# Patient Record
Sex: Male | Born: 1950 | Race: White | Hispanic: No | Marital: Married | Smoking: Current every day smoker
Health system: Southern US, Community
[De-identification: ages and names within clinical notes are randomized; demographics above are authoritative.]

## PROBLEM LIST (undated history)

## (undated) DIAGNOSIS — E785 Hyperlipidemia, unspecified: Secondary | ICD-10-CM

## (undated) DIAGNOSIS — N200 Calculus of kidney: Secondary | ICD-10-CM

## (undated) DIAGNOSIS — E213 Hyperparathyroidism, unspecified: Secondary | ICD-10-CM

## (undated) DIAGNOSIS — N529 Male erectile dysfunction, unspecified: Secondary | ICD-10-CM

## (undated) DIAGNOSIS — E876 Hypokalemia: Secondary | ICD-10-CM

## (undated) DIAGNOSIS — M1990 Unspecified osteoarthritis, unspecified site: Secondary | ICD-10-CM

## (undated) HISTORY — DX: Male erectile dysfunction, unspecified: N52.9

## (undated) HISTORY — PX: THYROID SURGERY: SHX805

## (undated) HISTORY — PX: HERNIA REPAIR: SHX51

## (undated) HISTORY — PX: FRACTURE SURGERY: SHX138

## (undated) HISTORY — DX: Hypercalcemia: E83.52

## (undated) HISTORY — DX: Hyperlipidemia, unspecified: E78.5

## (undated) HISTORY — PX: BACK SURGERY: SHX140

## (undated) HISTORY — PX: HYDROCELE EXCISION: SHX482

## (undated) HISTORY — PX: VASECTOMY: SHX75

## (undated) HISTORY — DX: Hyperparathyroidism, unspecified: E21.3

---

## 2014-06-09 ENCOUNTER — Encounter: Payer: Self-pay | Admitting: *Deleted

## 2014-07-11 ENCOUNTER — Ambulatory Visit (INDEPENDENT_AMBULATORY_CARE_PROVIDER_SITE_OTHER): Payer: BLUE CROSS/BLUE SHIELD | Admitting: Family Medicine

## 2014-07-11 ENCOUNTER — Encounter: Payer: Self-pay | Admitting: Family Medicine

## 2014-07-11 VITALS — BP 132/68 | HR 68 | Temp 97.7°F | Resp 18 | Ht 68.0 in | Wt 176.0 lb

## 2014-07-11 DIAGNOSIS — Z7189 Other specified counseling: Secondary | ICD-10-CM

## 2014-07-11 DIAGNOSIS — Z720 Tobacco use: Secondary | ICD-10-CM

## 2014-07-11 DIAGNOSIS — Z7689 Persons encountering health services in other specified circumstances: Secondary | ICD-10-CM

## 2014-07-11 DIAGNOSIS — Z23 Encounter for immunization: Secondary | ICD-10-CM

## 2014-07-11 DIAGNOSIS — D485 Neoplasm of uncertain behavior of skin: Secondary | ICD-10-CM

## 2014-07-11 DIAGNOSIS — F17200 Nicotine dependence, unspecified, uncomplicated: Secondary | ICD-10-CM

## 2014-07-11 DIAGNOSIS — E785 Hyperlipidemia, unspecified: Secondary | ICD-10-CM

## 2014-07-11 NOTE — Progress Notes (Signed)
Subjective:    Patient ID: James Hays, male    DOB: 1951/05/08, 64 y.o.   MRN: [id number]  HPI Patient is a very pleasant 64 year old white male who is here today to establish care. Patient does have erectile dysfunction and would like to try Viagra. Otherwise he has no concerns today. Patient is a pack-a-day smoker. He is in the pre-contemplative phase for smoking cessation. His flu shot is up-to-date however he has never had a pneumonia vaccine. He has never had a colonoscopy. On examination today there is a 5 mm erythematous scaly papule on his right neck which is either an inflamed Seborrheic keratosis or a squamous cell carcinoma. This lesion has been there for 2 months and is growing and bleeds occasionally. He is not yet due for complete physical exam or lab work as his last appointment was less than a year ago with his previous doctor in [place]. Past Medical History  Diagnosis Date  . Hyperlipidemia    Past Surgical History  Procedure Laterality Date  . Vasectomy    . Fracture surgery      L foot  . Hydrocele excision    . Hernia repair      inguinal and umbilical   No current outpatient prescriptions on file prior to visit.   No current facility-administered medications on file prior to visit.   No Known Allergies History   Social History  . Marital Status: Married    Spouse Name: N/A    Number of Children: N/A  . Years of Education: N/A   Occupational History  . Not on file.   Social History Main Topics  . Smoking status: Current Every Day Smoker -- 1.00 packs/day    Types: Cigarettes  . Smokeless tobacco: Never Used  . Alcohol Use: No  . Drug Use: No  . Sexual Activity: Yes     Comment: work for [employer]    Other Topics Concern  . Not on file   Social History Narrative   Family History  Problem Relation Age of Onset  . Hypertension Mother   . Diabetes Mother   . Heart disease Father 73  . Hyperlipidemia Father   . Diabetes Brother       Review  of Systems  All other systems reviewed and are negative.      Objective:   Physical Exam  Constitutional: He is oriented to person, place, and time. He appears well-developed and well-nourished. No distress.  HENT:  Head: Normocephalic and atraumatic.  Right Ear: External ear normal.  Left Ear: External ear normal.  Nose: Nose normal.  Mouth/Throat: Oropharynx is clear and moist. No oropharyngeal exudate.  Eyes: Conjunctivae and EOM are normal. Pupils are equal, round, and reactive to light. Right eye exhibits no discharge. Left eye exhibits no discharge. No scleral icterus.  Neck: Neck supple. No JVD present.  Cardiovascular: Normal rate, regular rhythm and normal heart sounds.  Exam reveals no gallop and no friction rub.   No murmur heard. Pulmonary/Chest: Effort normal and breath sounds normal. No respiratory distress. He has no wheezes. He has no rales.  Abdominal: Soft. Bowel sounds are normal.  Musculoskeletal: He exhibits no edema.  Lymphadenopathy:    He has no cervical adenopathy.  Neurological: He is alert and oriented to person, place, and time. He has normal reflexes. He displays normal reflexes. No cranial nerve deficit. Coordination normal.  Skin: Rash noted. He is not diaphoretic. There is erythema.  Vitals reviewed.  Assessment & Plan:  Need for prophylactic vaccination against Streptococcus pneumoniae (pneumococcus) - Plan: Pneumococcal polysaccharide vaccine 23-valent greater than or equal to 2yo subcutaneous/IM  Neoplasm of uncertain behavior of skin - Plan: Pathology  HLD (hyperlipidemia)  Smoker  Establishing care with new doctor, encounter for  I encouraged smoking cessation. I recommended the patient get Pneumovax 23 because of his smoking. I would like the patient to return fasting for a CBC, CMP, fasting lipid panel, and PSA in preparation for complete physical exam. I anesthetized the lesion on his right neck using 0.1% lidocaine and then  performed a shave biopsy using sterile technique. The lesion was sent to pathology in a labeled container. Hemostasis was obtained using Drysol.. I gave the patient Viagra 50 mg tablets to use as samples for erectile dysfunction

## 2014-07-13 LAB — PATHOLOGY

## 2014-07-15 ENCOUNTER — Telehealth: Payer: Self-pay | Admitting: Family Medicine

## 2014-07-15 NOTE — Telephone Encounter (Signed)
-----   Message from [name], MD sent at 07/14/2014  7:08 AM EST ----- Area was cancer and spreads deeper.  Needs wider excision which we can do here if he so desires.

## 2014-07-15 NOTE — Telephone Encounter (Signed)
Pt made aware of pathology report and has opted for Dr Dennard Schaumann to due next procedure.  Appt made.

## 2014-07-22 ENCOUNTER — Ambulatory Visit: Payer: Self-pay | Admitting: Family Medicine

## 2014-07-26 ENCOUNTER — Encounter: Payer: Self-pay | Admitting: Family Medicine

## 2014-07-26 ENCOUNTER — Ambulatory Visit (INDEPENDENT_AMBULATORY_CARE_PROVIDER_SITE_OTHER): Payer: BLUE CROSS/BLUE SHIELD | Admitting: Family Medicine

## 2014-07-26 VITALS — BP 110/72 | HR 80 | Temp 97.9°F | Resp 16 | Wt 170.0 lb

## 2014-07-26 DIAGNOSIS — C4492 Squamous cell carcinoma of skin, unspecified: Secondary | ICD-10-CM

## 2014-07-26 NOTE — Progress Notes (Signed)
   Subjective:    Patient ID: James Hays, male    DOB: May 14, 1951, 64 y.o.   MRN: [id number]  HPI James Hays 79-year-old white male. Please see my previous office visit. At that time we performed a shave biopsy of the lesion on his right neck just below the angle of the mandible. 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 Unfortunately the deep margin of the biopsy was still involved. The patient is here today for wider excision. Past Medical History  Diagnosis Date  . Hyperlipidemia    Past Surgical History  Procedure Laterality Date  . Vasectomy    . Fracture surgery      L foot  . Hydrocele excision    . Hernia repair      inguinal and umbilical   Current Outpatient Prescriptions on File Prior to Visit  Medication Sig Dispense Refill  . aspirin 81 MG tablet Take 81 mg by mouth daily.    . tadalafil (CIALIS) 10 MG tablet Take 10 mg by mouth daily as needed for erectile dysfunction.     No current facility-administered medications on file prior to visit.   No Known Allergies History   Social History  . Marital Status: Married    Spouse Name: N/A    Number of Children: N/A  . Years of Education: N/A   Occupational History  . Not on file.   Social History Main Topics  . Smoking status: Current Every Day Smoker -- 1.00 packs/day    Types: Cigarettes  . Smokeless tobacco: Never Used  . Alcohol Use: No  . Drug Use: No  . Sexual Activity: Yes     Comment: work for [employer]    Other Topics Concern  . Not on file   Social History Narrative      Review of Systems  All other systems reviewed and are negative.      Objective:   Physical Exam  Constitutional: He appears well-developed and well-nourished.  Cardiovascular: Normal rate and regular rhythm.   Pulmonary/Chest: Effort normal and breath sounds normal.  Vitals reviewed. There is a 5 mm erythematous scar on the upper right neck just below the angle of the mandible with some fine white scale which is the  previous biopsy site. The area was inked in an elliptical fashion 1.5 cm x 2 cm in a diagonal fashion following the skin folds.        Assessment & Plan:  SCC (squamous cell carcinoma) - Plan: Pathology  Area was anesthetized with 0.1% lidocaine with epinephrine. The patient was then prepped and draped in a sterile fashion.  A 1.5 cm x 2 cm elliptical excision was performed down to the subcutaneous fat approximately 1/8 of an inch deep. The lesion was sent to pathology and labeled container. The excision was performed in a diagonal fashion to follow the skin folds. The excision edges were then approximated using 3 simple interrupted 3-0 Ethilon sutures. There was minimal blood loss. The wound was then covered with Polysporin and a Band-Aid. Sutures out in one week.Marland Kitchen

## 2014-07-28 LAB — PATHOLOGY

## 2014-08-02 ENCOUNTER — Encounter: Payer: Self-pay | Admitting: Family Medicine

## 2014-08-02 ENCOUNTER — Ambulatory Visit (INDEPENDENT_AMBULATORY_CARE_PROVIDER_SITE_OTHER): Payer: BLUE CROSS/BLUE SHIELD | Admitting: Family Medicine

## 2014-08-02 VITALS — BP 130/72 | HR 74 | Temp 97.7°F | Resp 16

## 2014-08-02 DIAGNOSIS — L03221 Cellulitis of neck: Secondary | ICD-10-CM

## 2014-08-02 MED ORDER — SULFAMETHOXAZOLE-TRIMETHOPRIM 800-160 MG PO TABS: 1.0000 | ORAL_TABLET | Freq: Two times a day (BID) | ORAL | Status: DC

## 2014-08-02 NOTE — Progress Notes (Signed)
   Subjective:    Patient ID: James Hays, male    DOB: 10-06-1950, 64 y.o.   MRN: [id number]  HPI  please see my last office visit. One week ago, the patient underwent an excision of a squamous cell carcinoma in situ on his right neck. Biopsy confirmed complete excision of the cancer. Patient is here today for suture removal. He has 3 sutures in the right side of his neck. However the superior aspect of the incision is erythematous. When I removed the suture,  I am able to express a small amount of pus from the superior aspect of the wound. I was able to remove the bottom 2 sutures without any difficulty. It does appear that the wound is become superficially infected with most likely staph. Past Medical History  Diagnosis Date  . Hyperlipidemia    Past Surgical History  Procedure Laterality Date  . Vasectomy    . Fracture surgery      L foot  . Hydrocele excision    . Hernia repair      inguinal and umbilical   Current Outpatient Prescriptions on File Prior to Visit  Medication Sig Dispense Refill  . aspirin 81 MG tablet Take 81 mg by mouth daily.    . tadalafil (CIALIS) 10 MG tablet Take 10 mg by mouth daily as needed for erectile dysfunction.     No current facility-administered medications on file prior to visit.   No Known Allergies History   Social History  . Marital Status: Married    Spouse Name: N/A    Number of Children: N/A  . Years of Education: N/A   Occupational History  . Not on file.   Social History Main Topics  . Smoking status: Current Every Day Smoker -- 1.00 packs/day    Types: Cigarettes  . Smokeless tobacco: Never Used  . Alcohol Use: No  . Drug Use: No  . Sexual Activity: Yes     Comment: work for [employer]    Other Topics Concern  . Not on file   Social History Narrative      Review of Systems  All other systems reviewed and are negative.      Objective:   Physical Exam  Cardiovascular: Normal rate and regular rhythm.     Pulmonary/Chest: Effort normal and breath sounds normal.  Skin: There is erythema.   please see the description and history of preent illness        Assessment & Plan:  Cellulitis of neck - Plan: sulfamethoxazole-trimethoprim (SEPTRA DS) 800-160 MG per tablet   3 sutures removed without difficulty. However the superior suture express purulent drainage after I removed it. I was able to express all of the purulent material from the wound. Hopefully this will be sufficient just draining the small collection of infection to allow the infection to heal. If the redness begins to spread out the patient to start Bactrim double strength tablets 1 by mouth twice a day for 7 days. Also recommended that he apply Neosporin to this area twice daily for the next 3 days or until healed.

## 2014-09-12 ENCOUNTER — Encounter: Payer: Self-pay | Admitting: Family Medicine

## 2014-09-12 ENCOUNTER — Ambulatory Visit (INDEPENDENT_AMBULATORY_CARE_PROVIDER_SITE_OTHER): Payer: BLUE CROSS/BLUE SHIELD | Admitting: Family Medicine

## 2014-09-12 VITALS — BP 110/70 | HR 78 | Temp 98.2°F | Resp 20 | Ht 68.0 in | Wt 170.0 lb

## 2014-09-12 DIAGNOSIS — J329 Chronic sinusitis, unspecified: Secondary | ICD-10-CM

## 2014-09-12 DIAGNOSIS — J310 Chronic rhinitis: Secondary | ICD-10-CM

## 2014-09-12 MED ORDER — LEVOFLOXACIN 500 MG PO TABS: 500.0000 mg | ORAL_TABLET | Freq: Every day | ORAL | Status: DC

## 2014-09-12 NOTE — Progress Notes (Signed)
Subjective:    Patient ID: James Hays, male    DOB: 12-29-1950, 64 y.o.   MRN: [id number]  HPI Patient presents with 4 days of moderate frontal sinus headache, sinus pressure, rhinorrhea, head congestion, subjective fevers, postnasal drip, and coughing. His wife has the exact same symptoms. They began simultaneously. He is tender to palpation in both frontal and maxillary sinuses on examination. He is significant mucosal edema and clear rhinorrhea. He also appears to have bilateral middle ear effusions. Past Medical History  Diagnosis Date  . Hyperlipidemia    Past Surgical History  Procedure Laterality Date  . Vasectomy    . Fracture surgery      L foot  . Hydrocele excision    . Hernia repair      inguinal and umbilical   Current Outpatient Prescriptions on File Prior to Visit  Medication Sig Dispense Refill  . aspirin 81 MG tablet Take 81 mg by mouth daily.    . tadalafil (CIALIS) 10 MG tablet Take 10 mg by mouth daily as needed for erectile dysfunction.     No current facility-administered medications on file prior to visit.   No Known Allergies History   Social History  . Marital Status: Married    Spouse Name: N/A  . Number of Children: N/A  . Years of Education: N/A   Occupational History  . Not on file.   Social History Main Topics  . Smoking status: Current Every Day Smoker -- 1.00 packs/day    Types: Cigarettes  . Smokeless tobacco: Never Used  . Alcohol Use: No  . Drug Use: No  . Sexual Activity: Yes     Comment: work for [employer]    Other Topics Concern  . Not on file   Social History Narrative      Review of Systems  All other systems reviewed and are negative.      Objective:   Physical Exam  Constitutional: He appears well-developed and well-nourished. No distress.  HENT:  Right Ear: External ear normal. A middle ear effusion is present.  Left Ear: External ear normal. A middle ear effusion is present.  Nose: Mucosal edema and rhinorrhea  present. Right sinus exhibits maxillary sinus tenderness and frontal sinus tenderness. Left sinus exhibits maxillary sinus tenderness and frontal sinus tenderness.  Eyes: Conjunctivae are normal.  Neck: Neck supple.  Cardiovascular: Normal rate, regular rhythm and normal heart sounds.   Pulmonary/Chest: Effort normal. He has wheezes. He has no rales. He exhibits no tenderness.  Lymphadenopathy:    He has no cervical adenopathy.  Skin: He is not diaphoretic.  Vitals reviewed.         Assessment & Plan:  Rhinosinusitis - Plan: levofloxacin (LEVAQUIN) 500 MG tablet  Patient has acute rhinosinusitis. I explained to the patient that this is most likely a virus. It will take 7-10 days to run its course. I anticipate that the patient will feel poorly until Friday or Saturday. I recommended tincture of time using Sudafed and other decongestants such as Zyrtec to help alleviate his symptoms. He can also take Tylenol or ibuprofen as needed for fever and chills and body aches. I did give the patient a prescription for Levaquin 500 mg by mouth daily for 7 days. I explained to the patient not to get this antibiotic unless he develops extremely high fever or less the symptoms persist through next Friday or suddenly worsen. I did give him prescription for 14 tablets so that his wife who is also  my patient could have a week supply as well.

## 2015-01-17 ENCOUNTER — Telehealth: Payer: Self-pay | Admitting: Family Medicine

## 2015-01-17 ENCOUNTER — Encounter (HOSPITAL_COMMUNITY): Payer: Self-pay | Admitting: Emergency Medicine

## 2015-01-17 ENCOUNTER — Emergency Department (HOSPITAL_COMMUNITY)
Admission: EM | Admit: 2015-01-17 | Discharge: 2015-01-17 | Disposition: A | Discharge status: Home / Self Care | Payer: BLUE CROSS/BLUE SHIELD | Attending: Emergency Medicine | Admitting: Emergency Medicine

## 2015-01-17 DIAGNOSIS — M5441 Lumbago with sciatica, right side: Secondary | ICD-10-CM | POA: Insufficient documentation

## 2015-01-17 DIAGNOSIS — Z79899 Other long term (current) drug therapy: Secondary | ICD-10-CM | POA: Insufficient documentation

## 2015-01-17 DIAGNOSIS — M545 Low back pain: Secondary | ICD-10-CM | POA: Diagnosis present

## 2015-01-17 DIAGNOSIS — E785 Hyperlipidemia, unspecified: Secondary | ICD-10-CM | POA: Diagnosis not present

## 2015-01-17 DIAGNOSIS — Z720 Tobacco use: Secondary | ICD-10-CM | POA: Diagnosis not present

## 2015-01-17 DIAGNOSIS — Z7982 Long term (current) use of aspirin: Secondary | ICD-10-CM | POA: Diagnosis not present

## 2015-01-17 MED ORDER — CYCLOBENZAPRINE HCL 10 MG PO TABS: 10.0000 mg | ORAL_TABLET | Freq: Two times a day (BID) | ORAL | Status: DC | PRN

## 2015-01-17 MED ORDER — KETOROLAC TROMETHAMINE 60 MG/2ML IM SOLN
60.0000 mg | Freq: Once | INTRAMUSCULAR | Status: AC
  Administered 2015-01-17: 60 mg via INTRAMUSCULAR
  Filled 2015-01-17: qty 2

## 2015-01-17 MED ORDER — PREDNISONE 50 MG PO TABS: ORAL_TABLET | ORAL | Status: DC

## 2015-01-17 MED ORDER — PREDNISONE 50 MG PO TABS
60.0000 mg | ORAL_TABLET | Freq: Once | ORAL | Status: AC
  Administered 2015-01-17: 60 mg via ORAL
  Filled 2015-01-17 (×2): qty 1

## 2015-01-17 MED ORDER — OXYCODONE-ACETAMINOPHEN 5-325 MG PO TABS: 1.0000 | ORAL_TABLET | Freq: Four times a day (QID) | ORAL | Status: DC | PRN

## 2015-01-17 NOTE — Telephone Encounter (Signed)
I agree, NTBS

## 2015-01-17 NOTE — ED Notes (Signed)
While getting pt in gown, pt says that he has had several tick bites in last four weeks.

## 2015-01-17 NOTE — ED Notes (Signed)
MD at bedside. 

## 2015-01-17 NOTE — Discharge Instructions (Signed)
Prescription for pain medicine, prednisone, muscle relaxer. You can continue to take ibuprofen over-the-counter. Take all your medication with food. I'm concerned about a herniated disc. You will need to follow-up with your primary care doctor. He may order a MRI scan of your lower spine

## 2015-01-17 NOTE — Telephone Encounter (Signed)
Saw chiropractor yesterday.  In severe pain today, can't catch his breath.  Can hardly walk.  Wanting you to prescribe him pain meds.  Told him if in that much pain (he sounded in pain) needs to go to ED or back to his chiropractor.

## 2015-01-17 NOTE — ED Notes (Signed)
Pt went to see chiroprator yesterday for adjustment for lower back pain-- however, last night the pain in his back worstened and now radiates down into his Rt leg .

## 2015-01-17 NOTE — ED Provider Notes (Signed)
CSN: [id number]     Arrival date & time 01/17/15  1525 History   First MD Initiated Contact with Patient 01/17/15 1535     Chief Complaint  Patient presents with  . Back Pain     (Consider location/radiation/quality/duration/timing/severity/associated sxs/prior Treatment) HPI.... Right lower back pain for several days. No new injury. Patient went to chiropractor yesterday and was "adjusted". Now his pain travels down the right leg. No bowel or bladder incontinence. He has been taking ibuprofen 600 mg with minimal relief. No previous back problems. Positioning and palpation make pain worse.  Past Medical History  Diagnosis Date  . Hyperlipidemia    Past Surgical History  Procedure Laterality Date  . Vasectomy    . Fracture surgery      L foot  . Hydrocele excision    . Hernia repair      inguinal and umbilical   Family History  Problem Relation Age of Onset  . Hypertension Mother   . Diabetes Mother   . Heart disease Father 28  . Hyperlipidemia Father   . Diabetes Brother    History  Substance Use Topics  . Smoking status: Current Every Day Smoker -- 1.00 packs/day    Types: Cigarettes  . Smokeless tobacco: Never Used  . Alcohol Use: No    Review of Systems  All other systems reviewed and are negative.     Allergies  Review of patient's allergies indicates no known allergies.  Home Medications   Prior to Admission medications   Medication Sig Start Date End Date Taking? Authorizing Provider  aspirin 81 MG tablet Take 81 mg by mouth daily.   Yes Historical Provider, MD  cholecalciferol (VITAMIN D) 1000 UNITS tablet Take 1,000 Units by mouth daily.   Yes Historical Provider, MD  tadalafil (CIALIS) 10 MG tablet Take 10 mg by mouth daily as needed for erectile dysfunction.   Yes Historical Provider, MD  cyclobenzaprine (FLEXERIL) 10 MG tablet Take 1 tablet (10 mg total) by mouth 2 (two) times daily as needed for muscle spasms. 01/17/15   [name], MD   oxyCODONE-acetaminophen (PERCOCET/ROXICET) 5-325 MG per tablet Take 1-2 tablets by mouth every 6 (six) hours as needed. 01/17/15   [name], MD  predniSONE (DELTASONE) 50 MG tablet 1 tablet daily for 7 days 01/17/15   [name], MD   BP 138/78 mmHg  Pulse 90  Temp(Src) 97.8 F (36.6 C) (Oral)  Resp 18  Ht 5\' 8"  (1.727 m)  Wt 162 lb (73.483 kg)  BMI 24.64 kg/m2  SpO2 100% Physical Exam  Constitutional: He is oriented to person, place, and time. He appears well-developed and well-nourished.  HENT:  Head: Normocephalic and atraumatic.  Eyes: Conjunctivae and EOM are normal. Pupils are equal, round, and reactive to light.  Neck: Normal range of motion. Neck supple.  Cardiovascular: Normal rate and regular rhythm.   Pulmonary/Chest: Effort normal and breath sounds normal.  Abdominal: Soft. Bowel sounds are normal.  Musculoskeletal:  Muscular tenderness right lower back. Pain with straight leg raise on the right.  Neurological: He is alert and oriented to person, place, and time.  Skin: Skin is warm and dry.  Psychiatric: He has a normal mood and affect. His behavior is normal.  Nursing note and vitals reviewed.   ED Course  Procedures (including critical care time) Labs Review Labs Reviewed - No data to display  Imaging Review No results found.   EKG Interpretation None      MDM  Final diagnoses:  Right-sided low back pain with right-sided sciatica    I discussed the possibility of a herniated disc with the patient. He understands. I prescribed Toradol 60 mg IM and prednisone 60 mg in the ED. Discharge medication Percocet, Flexeril 10 mg, prednisone.  He will follow-up with his primary care doctor. He may need an MRI in the future.    [name], MD 01/17/15 [phone]

## 2015-01-20 ENCOUNTER — Telehealth: Payer: Self-pay | Admitting: Family Medicine

## 2015-01-20 NOTE — Telephone Encounter (Signed)
Patient has dropped off FMLA forms he has an appointment on Monday.

## 2015-01-23 ENCOUNTER — Encounter: Payer: Self-pay | Admitting: Family Medicine

## 2015-01-23 ENCOUNTER — Ambulatory Visit (INDEPENDENT_AMBULATORY_CARE_PROVIDER_SITE_OTHER): Payer: BLUE CROSS/BLUE SHIELD | Admitting: Family Medicine

## 2015-01-23 VITALS — BP 118/70 | HR 100 | Temp 98.0°F | Resp 24 | Ht 68.0 in | Wt 170.0 lb

## 2015-01-23 DIAGNOSIS — M5386 Other specified dorsopathies, lumbar region: Secondary | ICD-10-CM

## 2015-01-23 DIAGNOSIS — M5431 Sciatica, right side: Secondary | ICD-10-CM | POA: Diagnosis not present

## 2015-01-23 MED ORDER — OXYCODONE-ACETAMINOPHEN 5-325 MG PO TABS: 2.0000 | ORAL_TABLET | ORAL | Status: DC | PRN

## 2015-01-23 NOTE — Telephone Encounter (Signed)
Received FMLA ppw on my desk pt was seen today by Dr. 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  I have filled out Section lll for provider  Routed to Dr. Dennard Schaumann

## 2015-01-23 NOTE — Progress Notes (Signed)
Subjective:    Patient ID: James Hays, male    DOB: [birth year], 64 y.o.   MRN: [id number]  HPI  One week ago, the patient went to see his chiropractor due to some low back pain. Immediately upon manipulation of the spine, the patient developed searing pain in his lower back radiating into his right gluteus and down his right leg. Patient states that he can barely stand. He can barely walk. Patient states that his right leg feels like it's on fire. He also reports weakness in his right leg. He went to the emergency room. I reviewed the emergency room records. They placed the patient on high-dose prednisone. However his symptoms are not improving. He is in substantial pain today. He has a positive right leg raise. Muscle strength is 4 over 5 in the right leg with hip flexion and extension and knee flexion and extension. He has diminished reflexes at the knee but I believe is secondary to muscle spasm and guarding. He is severely tender to palpation in his right lower spine. He is unable to work. Past Medical History  Diagnosis Date  . Hyperlipidemia    Past Surgical History  Procedure Laterality Date  . Vasectomy    . Fracture surgery      L foot  . Hydrocele excision    . Hernia repair      inguinal and umbilical   Current Outpatient Prescriptions on File Prior to Visit  Medication Sig Dispense Refill  . aspirin 81 MG tablet Take 81 mg by mouth daily.    . cholecalciferol (VITAMIN D) 1000 UNITS tablet Take 1,000 Units by mouth daily.    . predniSONE (DELTASONE) 50 MG tablet 1 tablet daily for 7 days 7 tablet 0  . tadalafil (CIALIS) 10 MG tablet Take 10 mg by mouth daily as needed for erectile dysfunction.    . cyclobenzaprine (FLEXERIL) 10 MG tablet Take 1 tablet (10 mg total) by mouth 2 (two) times daily as needed for muscle spasms. (Patient not taking: Reported on 01/23/2015) 20 tablet 0  . oxyCODONE-acetaminophen (PERCOCET/ROXICET) 5-325 MG per tablet Take 1-2 tablets by mouth every  6 (six) hours as needed. (Patient not taking: Reported on 01/23/2015) 16 tablet 0   No current facility-administered medications on file prior to visit.   No Known Allergies History   Social History  . Marital Status: Married    Spouse Name: N/A  . Number of Children: N/A  . Years of Education: N/A   Occupational History  . Not on file.   Social History Main Topics  . Smoking status: Current Every Day Smoker -- 1.00 packs/day    Types: Cigarettes  . Smokeless tobacco: Never Used  . Alcohol Use: No  . Drug Use: No  . Sexual Activity: Yes     Comment: work for [employer]    Other Topics Concern  . Not on file   Social History Narrative     Review of Systems  All other systems reviewed and are negative.      Objective:   Physical Exam  Constitutional: He appears distressed.  Cardiovascular: Normal rate, regular rhythm and normal heart sounds.   Pulmonary/Chest: Effort normal and breath sounds normal.  Musculoskeletal:       Lumbar back: He exhibits decreased range of motion, tenderness, bony tenderness, pain and spasm.  Vitals reviewed.         Assessment & Plan:  Sciatica of right side associated with disorder of lumbar spine - Plan:  oxyCODONE-acetaminophen (ROXICET) 5-325 MG per tablet, MR Lumbar Spine Wo Contrast  Patient has severe right-sided sciatica and is failing conservative therapy. He is already failed high-dose prednisone. I recommended Percocet 5/325 one to 2 tablets every 4 hours as needed for pain. I recommended ibuprofen 800 mg every 8 hours. I will schedule the patient for an MRI of the lumbar spine as soon as possible. He would likely benefit from epidural sterile injection

## 2015-01-25 ENCOUNTER — Ambulatory Visit (HOSPITAL_COMMUNITY)
Admission: RE | Admit: 2015-01-25 | Discharge: 2015-01-25 | Disposition: A | Discharge status: Home / Self Care | Payer: BLUE CROSS/BLUE SHIELD | Source: Ambulatory Visit | Attending: Family Medicine | Admitting: Family Medicine

## 2015-01-25 DIAGNOSIS — M79604 Pain in right leg: Secondary | ICD-10-CM | POA: Diagnosis not present

## 2015-01-25 DIAGNOSIS — M5136 Other intervertebral disc degeneration, lumbar region: Secondary | ICD-10-CM | POA: Insufficient documentation

## 2015-01-25 DIAGNOSIS — M5126 Other intervertebral disc displacement, lumbar region: Secondary | ICD-10-CM | POA: Insufficient documentation

## 2015-01-25 DIAGNOSIS — M5386 Other specified dorsopathies, lumbar region: Secondary | ICD-10-CM

## 2015-01-25 DIAGNOSIS — M545 Low back pain: Secondary | ICD-10-CM | POA: Diagnosis present

## 2015-01-26 ENCOUNTER — Other Ambulatory Visit: Payer: Self-pay | Admitting: Family Medicine

## 2015-01-26 DIAGNOSIS — G589 Mononeuropathy, unspecified: Secondary | ICD-10-CM

## 2015-01-26 NOTE — Telephone Encounter (Signed)
Received ppw back on desk from provider and informed pt that they were filled out and ready to be faxed.   I faxed the form to his employer and pt had office visit so no form fee was required.

## 2015-01-27 ENCOUNTER — Telehealth: Payer: Self-pay | Admitting: Family Medicine

## 2015-01-27 NOTE — Telephone Encounter (Signed)
Patient is calling to tell you to cancel the neuro at [facility], he has one with dr [name]  [phone]

## 2015-01-31 ENCOUNTER — Telehealth: Payer: Self-pay | Admitting: Family Medicine

## 2015-01-31 NOTE — Telephone Encounter (Signed)
ok 

## 2015-01-31 NOTE — Telephone Encounter (Signed)
Patient was just making dr pickard aware that he was having back surgery tomorrow  [phone]

## 2015-02-03 NOTE — Telephone Encounter (Signed)
noted 

## 2015-02-10 ENCOUNTER — Encounter (HOSPITAL_COMMUNITY): Payer: Self-pay | Admitting: Emergency Medicine

## 2015-02-10 ENCOUNTER — Inpatient Hospital Stay (HOSPITAL_COMMUNITY)
Admission: EM | Admit: 2015-02-10 | Discharge: 2015-02-24 | DRG: 028 | Disposition: A | Discharge status: Home Health | Payer: BLUE CROSS/BLUE SHIELD | Attending: Neurosurgery | Admitting: Neurosurgery

## 2015-02-10 ENCOUNTER — Telehealth: Payer: Self-pay | Admitting: Family Medicine

## 2015-02-10 DIAGNOSIS — F17210 Nicotine dependence, cigarettes, uncomplicated: Secondary | ICD-10-CM | POA: Diagnosis not present

## 2015-02-10 DIAGNOSIS — R0789 Other chest pain: Secondary | ICD-10-CM | POA: Diagnosis not present

## 2015-02-10 DIAGNOSIS — Z7982 Long term (current) use of aspirin: Secondary | ICD-10-CM

## 2015-02-10 DIAGNOSIS — E785 Hyperlipidemia, unspecified: Secondary | ICD-10-CM | POA: Diagnosis not present

## 2015-02-10 DIAGNOSIS — G9782 Other postprocedural complications and disorders of nervous system: Secondary | ICD-10-CM | POA: Diagnosis not present

## 2015-02-10 DIAGNOSIS — K5909 Other constipation: Secondary | ICD-10-CM | POA: Diagnosis present

## 2015-02-10 DIAGNOSIS — R079 Chest pain, unspecified: Secondary | ICD-10-CM | POA: Diagnosis present

## 2015-02-10 DIAGNOSIS — B9561 Methicillin susceptible Staphylococcus aureus infection as the cause of diseases classified elsewhere: Secondary | ICD-10-CM | POA: Diagnosis not present

## 2015-02-10 DIAGNOSIS — G061 Intraspinal abscess and granuloma: Secondary | ICD-10-CM | POA: Diagnosis present

## 2015-02-10 DIAGNOSIS — T814XXA Infection following a procedure, initial encounter: Secondary | ICD-10-CM | POA: Diagnosis present

## 2015-02-10 DIAGNOSIS — G960 Cerebrospinal fluid leak: Secondary | ICD-10-CM | POA: Diagnosis present

## 2015-02-10 DIAGNOSIS — T819XXA Unspecified complication of procedure, initial encounter: Secondary | ICD-10-CM | POA: Diagnosis present

## 2015-02-10 DIAGNOSIS — R51 Headache: Secondary | ICD-10-CM | POA: Diagnosis not present

## 2015-02-10 DIAGNOSIS — Z419 Encounter for procedure for purposes other than remedying health state, unspecified: Secondary | ICD-10-CM

## 2015-02-10 DIAGNOSIS — Z9889 Other specified postprocedural states: Secondary | ICD-10-CM | POA: Diagnosis present

## 2015-02-10 DIAGNOSIS — R7881 Bacteremia: Secondary | ICD-10-CM | POA: Diagnosis not present

## 2015-02-10 DIAGNOSIS — K5900 Constipation, unspecified: Secondary | ICD-10-CM | POA: Diagnosis not present

## 2015-02-10 LAB — CBC WITH DIFFERENTIAL/PLATELET
Basophils Absolute: 0 10*3/uL (ref 0.0–0.1)
Basophils Relative: 0 % (ref 0–1)
EOS ABS: 0 10*3/uL (ref 0.0–0.7)
EOS PCT: 0 % (ref 0–5)
HEMATOCRIT: 44.6 % (ref 39.0–52.0)
HEMOGLOBIN: 15 g/dL (ref 13.0–17.0)
LYMPHS ABS: 1.3 10*3/uL (ref 0.7–4.0)
Lymphocytes Relative: 11 % — ABNORMAL LOW (ref 12–46)
MCH: 31.9 pg (ref 26.0–34.0)
MCHC: 33.6 g/dL (ref 30.0–36.0)
MCV: 94.9 fL (ref 78.0–100.0)
MONOS PCT: 8 % (ref 3–12)
Monocytes Absolute: 1 10*3/uL (ref 0.1–1.0)
NEUTROS ABS: 9.9 10*3/uL — AB (ref 1.7–7.7)
NEUTROS PCT: 81 % — AB (ref 43–77)
Platelets: 304 10*3/uL (ref 150–400)
RBC: 4.7 MIL/uL (ref 4.22–5.81)
RDW: 13.6 % (ref 11.5–15.5)
WBC: 12.2 10*3/uL — AB (ref 4.0–10.5)

## 2015-02-10 LAB — BASIC METABOLIC PANEL
Anion gap: 11 (ref 5–15)
BUN: 11 mg/dL (ref 6–20)
CO2: 22 mmol/L (ref 22–32)
CREATININE: 1.05 mg/dL (ref 0.61–1.24)
Calcium: 11.2 mg/dL — ABNORMAL HIGH (ref 8.9–10.3)
Chloride: 102 mmol/L (ref 101–111)
GFR calc non Af Amer: >60 mL/min (ref >60)
GLUCOSE: 129 mg/dL — AB (ref 65–99)
Potassium: 4 mmol/L (ref 3.5–5.1)
Sodium: 135 mmol/L (ref 135–145)

## 2015-02-10 LAB — SEDIMENTATION RATE: SED RATE: 95 mm/h — AB (ref 0–16)

## 2015-02-10 MED ORDER — ONDANSETRON HCL 4 MG/2ML IJ SOLN
INTRAMUSCULAR | Status: AC
  Administered 2015-02-10: 4 mg via INTRAVENOUS
  Filled 2015-02-10: qty 2

## 2015-02-10 MED ORDER — SODIUM CHLORIDE 0.9 % IV SOLN: INTRAVENOUS | Status: DC

## 2015-02-10 MED ORDER — SODIUM CHLORIDE 0.9 % IV SOLN: 250.0000 mL | INTRAVENOUS | Status: DC

## 2015-02-10 MED ORDER — OXYCODONE-ACETAMINOPHEN 5-325 MG PO TABS
2.0000 | ORAL_TABLET | ORAL | Status: DC | PRN
  Administered 2015-02-10 – 2015-02-12 (×6): 2 via ORAL
  Administered 2015-02-12: 1 via ORAL
  Filled 2015-02-10 (×6): qty 2

## 2015-02-10 MED ORDER — SODIUM CHLORIDE 0.9 % IV SOLN
INTRAVENOUS | Status: DC
  Administered 2015-02-10: 20:00:00 via INTRAVENOUS

## 2015-02-10 MED ORDER — DIAZEPAM 5 MG PO TABS
5.0000 mg | ORAL_TABLET | Freq: Four times a day (QID) | ORAL | Status: DC | PRN
  Administered 2015-02-11 – 2015-02-19 (×6): 5 mg via ORAL
  Filled 2015-02-10 (×6): qty 1

## 2015-02-10 MED ORDER — ONDANSETRON HCL 4 MG/2ML IJ SOLN
4.0000 mg | Freq: Once | INTRAMUSCULAR | Status: AC
  Administered 2015-02-10: 4 mg via INTRAVENOUS

## 2015-02-10 MED ORDER — SODIUM CHLORIDE 0.9 % IV SOLN
INTRAVENOUS | Status: DC
  Administered 2015-02-12: 10:00:00 via INTRAVENOUS
  Administered 2015-02-13: 1000 mL via INTRAVENOUS
  Administered 2015-02-15 – 2015-02-16 (×3): via INTRAVENOUS
  Administered 2015-02-17: 1000 mL via INTRAVENOUS
  Administered 2015-02-18 – 2015-02-19 (×2): via INTRAVENOUS
  Administered 2015-02-19: 75 mL/h via INTRAVENOUS
  Administered 2015-02-21: 22:00:00 via INTRAVENOUS

## 2015-02-10 MED ORDER — HEPARIN SODIUM (PORCINE) 5000 UNIT/ML IJ SOLN
5000.0000 [IU] | Freq: Three times a day (TID) | INTRAMUSCULAR | Status: DC
  Administered 2015-02-11 – 2015-02-24 (×37): 5000 [IU] via SUBCUTANEOUS
  Filled 2015-02-10 (×46): qty 1

## 2015-02-10 MED ORDER — ACETAMINOPHEN 325 MG PO TABS
650.0000 mg | ORAL_TABLET | ORAL | Status: DC | PRN
  Administered 2015-02-11: 650 mg via ORAL
  Administered 2015-02-12: 325 mg via ORAL
  Administered 2015-02-21 – 2015-02-23 (×5): 650 mg via ORAL
  Filled 2015-02-10 (×10): qty 2

## 2015-02-10 MED ORDER — SODIUM CHLORIDE 0.9 % IV BOLUS (SEPSIS)
1000.0000 mL | Freq: Once | INTRAVENOUS | Status: AC
  Administered 2015-02-10: 1000 mL via INTRAVENOUS

## 2015-02-10 MED ORDER — SODIUM CHLORIDE 0.9 % IJ SOLN: 3.0000 mL | INTRAMUSCULAR | Status: DC | PRN

## 2015-02-10 MED ORDER — SODIUM CHLORIDE 0.9 % IJ SOLN
3.0000 mL | Freq: Two times a day (BID) | INTRAMUSCULAR | Status: DC
  Administered 2015-02-11 – 2015-02-24 (×15): 3 mL via INTRAVENOUS

## 2015-02-10 MED ORDER — ONDANSETRON HCL 4 MG/2ML IJ SOLN: 4.0000 mg | INTRAMUSCULAR | Status: DC | PRN

## 2015-02-10 MED ORDER — VITAMIN D 1000 UNITS PO TABS
1000.0000 [IU] | ORAL_TABLET | Freq: Every day | ORAL | Status: DC
  Administered 2015-02-11 – 2015-02-24 (×14): 1000 [IU] via ORAL
  Filled 2015-02-10 (×14): qty 1

## 2015-02-10 MED ORDER — ACETAMINOPHEN 650 MG RE SUPP
650.0000 mg | RECTAL | Status: DC | PRN
  Filled 2015-02-10: qty 1

## 2015-02-10 NOTE — Telephone Encounter (Signed)
Patients wife Kennyth Lose calling regarding his back surgery that he had, now saying that he is having some symptoms that she is concerned about please call her at [phone]

## 2015-02-10 NOTE — ED Notes (Signed)
Back surgery last Wednesday - today started having a severe headache and white fluid coming out of his back surgical surgery.. Pt actively vomiting in triage

## 2015-02-10 NOTE — ED Notes (Signed)
EDP at bedside  

## 2015-02-10 NOTE — ED Provider Notes (Signed)
CSN: [id number]     Arrival date & time 02/10/15  1833 History   First MD Initiated Contact with Patient 02/10/15 1844     Chief Complaint  Patient presents with  . Post-op Problem     (Consider location/radiation/quality/duration/timing/severity/associated sxs/prior Treatment) HPI   James Hays is a 64 y.o. male who presents for evaluation of headache and drainage from wound. He had lumbar spine surgery, 9 days ago. He is doing well, walking easily and improving daily, until this afternoon. At that time, he developed a headache which he describes as severe, and noticed copious amount of fluid draining from his surgical wound. The fluid has been enough to soak his clothing. He denies fever, chills, cough, shortness of breath or chest pain. He has had nausea and vomiting, this afternoon. The surgery was done in [place], [place], by Dr. [name]. There are no other known modifying factors.    Past Medical History  Diagnosis Date  . Hyperlipidemia    Past Surgical History  Procedure Laterality Date  . Vasectomy    . Fracture surgery      L foot  . Hydrocele excision    . Hernia repair      inguinal and umbilical  . Back surgery     Family History  Problem Relation Age of Onset  . Hypertension Mother   . Diabetes Mother   . Heart disease Father 33  . Hyperlipidemia Father   . Diabetes Brother    Social History  Substance Use Topics  . Smoking status: Current Every Day Smoker -- 1.00 packs/day    Types: Cigarettes  . Smokeless tobacco: Never Used  . Alcohol Use: No    Review of Systems  All other systems reviewed and are negative.     Allergies  Review of patient's allergies indicates no known allergies.  Home Medications   Prior to Admission medications   Medication Sig Start Date End Date Taking? Authorizing Provider  cholecalciferol (VITAMIN D) 1000 UNITS tablet Take 1,000 Units by mouth daily.   Yes Historical Provider, MD  diazepam (VALIUM) 5 MG  tablet Take 5-10 mg by mouth every 6 (six) hours as needed. 02/01/15  Yes Historical Provider, MD  oxyCODONE-acetaminophen (ROXICET) 5-325 MG per tablet Take 2 tablets by mouth every 4 (four) hours as needed for severe pain. 01/23/15  Yes [name], MD  tadalafil (CIALIS) 10 MG tablet Take 10 mg by mouth daily as needed for erectile dysfunction.   Yes Historical Provider, MD  aspirin 81 MG tablet Take 81 mg by mouth daily.    Historical Provider, MD  predniSONE (DELTASONE) 50 MG tablet 1 tablet daily for 7 days Patient not taking: Reported on 02/10/2015 01/17/15   [name], MD   BP 115/69 mmHg  Pulse 100  Temp(Src) 98.3 F (36.8 C) (Oral)  Resp 14  Ht 6' (1.829 m)  Wt 164 lb (74.39 kg)  BMI 22.24 kg/m2  SpO2 93% Physical Exam  Constitutional: He is oriented to person, place, and time. He appears well-developed and well-nourished.  HENT:  Head: Normocephalic and atraumatic.  Right Ear: External ear normal.  Left Ear: External ear normal.  Eyes: Conjunctivae and EOM are normal. Pupils are equal, round, and reactive to light.  Neck: Normal range of motion and phonation normal. Neck supple.  Cardiovascular: Normal rate, regular rhythm and normal heart sounds.   Pulmonary/Chest: Effort normal and breath sounds normal. No respiratory distress. He has no wheezes. He exhibits no bony tenderness.  Abdominal: Soft. There is no tenderness.  Musculoskeletal: Normal range of motion. He exhibits no edema.  He has pain limited flexion of the right hip. Normal strength left leg. Upper lumbar wound, with a punctate area at the superior aspect which is draining fluid, characterized by thin, and slightly yellow. There is no large dehiscence of the surgical wound. There is no surrounding erythema or fluctuance. There is no tenderness around the wound.  Neurological: He is alert and oriented to person, place, and time. No cranial nerve deficit or sensory deficit. He exhibits normal muscle tone.  Coordination normal.  Skin: Skin is warm, dry and intact.  Psychiatric: He has a normal mood and affect. His behavior is normal. Judgment and thought content normal.  Nursing note and vitals reviewed.   ED Course  Procedures (including critical care time)  Medications  0.9 %  sodium chloride infusion ( Intravenous New Bag/Given 02/10/15 1956)  ondansetron (ZOFRAN) injection 4 mg (4 mg Intravenous Given 02/10/15 1917)  sodium chloride 0.9 % bolus 1,000 mL (0 mLs Intravenous Stopped 02/10/15 1953)  sodium chloride 0.9 % bolus 1,000 mL (1,000 mLs Intravenous New Bag/Given 02/10/15 1926)    Patient Vitals for the past 24 hrs:  BP Temp Temp src Pulse Resp SpO2 Height Weight  02/10/15 2040 115/69 mmHg 98.3 F (36.8 C) Oral 100 14 93 % - -  02/10/15 2030 118/69 mmHg - - 99 14 94 % - -  02/10/15 2015 - - - 98 13 96 % - -  02/10/15 2000 114/76 mmHg - - 100 13 93 % - -  02/10/15 1945 - - - 99 14 96 % - -  02/10/15 1930 116/76 mmHg - - 94 15 96 % - -  02/10/15 1915 - - - 98 14 90 % - -  02/10/15 1900 112/72 mmHg - - 95 19 90 % - -  02/10/15 1847 116/71 mmHg - - - 26 96 % - -  02/10/15 1844 115/77 mmHg 98.5 F (36.9 C) Oral 104 20 96 % 6' (1.829 m) 164 lb (74.39 kg)    19:45- case discussed with on-call neurosurgeon, Dr. Kathyrn Sheriff. He accepts the patient in transfer, to [facility] hospital for observation and treatment. He does not recommend empiric antibiotics this time.    8:00 PM Reevaluation with update and discussion. After initial assessment and treatment, an updated evaluation reveals family members updated, all questions answered.Daleen Bo L    Labs Review Labs Reviewed  CBC WITH DIFFERENTIAL/PLATELET - Abnormal; Notable for the following:    WBC 12.2 (*)    Neutrophils Relative % 81 (*)    Neutro Abs 9.9 (*)    Lymphocytes Relative 11 (*)    All other components within normal limits  BASIC METABOLIC PANEL - Abnormal; Notable for the following:    Glucose, Bld 129 (*)     Calcium 11.2 (*)    All other components within normal limits  CULTURE, BLOOD (ROUTINE X 2)  CULTURE, BLOOD (ROUTINE X 2)  SEDIMENTATION RATE    Imaging Review No results found.    EKG Interpretation   Date/Time:  Friday February 10 2015 18:47:06 EDT Ventricular Rate:  98 PR Interval:  155 QRS Duration: 98 QT Interval:  336 QTC Calculation: 429 R Axis:   70 Text Interpretation:  Sinus rhythm No old tracing to compare Confirmed by  [facility]  MD, Amyriah Buras [phone]) on 02/10/2015 8:46:10 PM      MDM   Final diagnoses:  Other postoperative complication  involving nervous system    Apparent spinal fluid leak, without frank infection following lumbar spine surgery. Patient will require evaluation and treatment by a neurosurgeon, at a neurosurgery center. Doubt sepsis, metabolic instability or impending vascular collapse.  Nursing Notes Reviewed/ Care Coordinated Applicable Imaging Reviewed Interpretation of Laboratory Data incorporated into ED treatment   Plan: Transfer to [facility]  Daleen Bo, MD 02/10/15 2046

## 2015-02-10 NOTE — Telephone Encounter (Signed)
Spoke to [place] and she states that the pt has a pin hole left of non healing and it is draining really bad with yellowish tint and he has a horrific HA with vomiting. I told her to take him to the ER to be evaluated. She states that he is sleeping right now but as soon as he wakes she will take him. Informed her if he needed to be seen on Monday to call asap. She verbalized understanding.

## 2015-02-11 ENCOUNTER — Encounter (HOSPITAL_COMMUNITY): Payer: Self-pay | Admitting: Anesthesiology

## 2015-02-11 ENCOUNTER — Observation Stay (HOSPITAL_COMMUNITY): Payer: BLUE CROSS/BLUE SHIELD | Admitting: Certified Registered Nurse Anesthetist

## 2015-02-11 ENCOUNTER — Observation Stay (HOSPITAL_COMMUNITY): Payer: BLUE CROSS/BLUE SHIELD

## 2015-02-11 ENCOUNTER — Encounter (HOSPITAL_COMMUNITY)
Admission: EM | Disposition: A | Discharge status: Home Health | Payer: Self-pay | Source: Home / Self Care | Attending: Neurosurgery

## 2015-02-11 DIAGNOSIS — G9782 Other postprocedural complications and disorders of nervous system: Secondary | ICD-10-CM | POA: Diagnosis present

## 2015-02-11 DIAGNOSIS — G061 Intraspinal abscess and granuloma: Secondary | ICD-10-CM | POA: Diagnosis present

## 2015-02-11 DIAGNOSIS — Z9889 Other specified postprocedural states: Secondary | ICD-10-CM | POA: Diagnosis not present

## 2015-02-11 DIAGNOSIS — F17210 Nicotine dependence, cigarettes, uncomplicated: Secondary | ICD-10-CM | POA: Diagnosis present

## 2015-02-11 DIAGNOSIS — R509 Fever, unspecified: Secondary | ICD-10-CM | POA: Diagnosis not present

## 2015-02-11 DIAGNOSIS — G960 Cerebrospinal fluid leak: Secondary | ICD-10-CM | POA: Diagnosis present

## 2015-02-11 DIAGNOSIS — E785 Hyperlipidemia, unspecified: Secondary | ICD-10-CM | POA: Diagnosis present

## 2015-02-11 DIAGNOSIS — Z7982 Long term (current) use of aspirin: Secondary | ICD-10-CM | POA: Diagnosis not present

## 2015-02-11 DIAGNOSIS — R51 Headache: Secondary | ICD-10-CM | POA: Diagnosis present

## 2015-02-11 DIAGNOSIS — K5909 Other constipation: Secondary | ICD-10-CM | POA: Diagnosis not present

## 2015-02-11 DIAGNOSIS — R0789 Other chest pain: Secondary | ICD-10-CM | POA: Diagnosis not present

## 2015-02-11 DIAGNOSIS — T814XXA Infection following a procedure, initial encounter: Secondary | ICD-10-CM | POA: Diagnosis present

## 2015-02-11 DIAGNOSIS — K5900 Constipation, unspecified: Secondary | ICD-10-CM | POA: Diagnosis present

## 2015-02-11 DIAGNOSIS — B9561 Methicillin susceptible Staphylococcus aureus infection as the cause of diseases classified elsewhere: Secondary | ICD-10-CM | POA: Diagnosis present

## 2015-02-11 DIAGNOSIS — R7881 Bacteremia: Secondary | ICD-10-CM | POA: Diagnosis present

## 2015-02-11 DIAGNOSIS — R079 Chest pain, unspecified: Secondary | ICD-10-CM | POA: Diagnosis not present

## 2015-02-11 DIAGNOSIS — Z419 Encounter for procedure for purposes other than remedying health state, unspecified: Secondary | ICD-10-CM | POA: Diagnosis not present

## 2015-02-11 HISTORY — PX: PLACEMENT OF LUMBAR DRAIN: SHX6028

## 2015-02-11 LAB — GLUCOSE, CAPILLARY: Glucose-Capillary: 90 mg/dL (ref 65–99)

## 2015-02-11 SURGERY — PLACEMENT OF LUMBAR DRAIN
Anesthesia: General | Site: Back

## 2015-02-11 MED ORDER — HYDROMORPHONE HCL 1 MG/ML IJ SOLN
0.2500 mg | INTRAMUSCULAR | Status: DC | PRN
  Administered 2015-02-11 – 2015-02-12 (×3): 0.5 mg via INTRAVENOUS
  Filled 2015-02-11: qty 1

## 2015-02-11 MED ORDER — OXYCODONE HCL 5 MG/5ML PO SOLN: 5.0000 mg | Freq: Once | ORAL | Status: DC | PRN

## 2015-02-11 MED ORDER — MIDAZOLAM HCL 5 MG/5ML IJ SOLN
INTRAMUSCULAR | Status: DC | PRN
  Administered 2015-02-11 (×2): 1 mg via INTRAVENOUS

## 2015-02-11 MED ORDER — ALPRAZOLAM 0.5 MG PO TABS
0.5000 mg | ORAL_TABLET | Freq: Two times a day (BID) | ORAL | Status: DC | PRN
  Administered 2015-02-11 – 2015-02-24 (×9): 0.5 mg via ORAL
  Filled 2015-02-11 (×10): qty 1

## 2015-02-11 MED ORDER — MIDAZOLAM HCL 2 MG/2ML IJ SOLN
INTRAMUSCULAR | Status: AC
  Filled 2015-02-11: qty 4

## 2015-02-11 MED ORDER — LIDOCAINE-EPINEPHRINE 1 %-1:100000 IJ SOLN
INTRAMUSCULAR | Status: DC | PRN
Start: 2015-02-11 — End: 2015-02-11
  Administered 2015-02-11: 5 mL via INTRADERMAL

## 2015-02-11 MED ORDER — CEFAZOLIN SODIUM-DEXTROSE 2-3 GM-% IV SOLR
INTRAVENOUS | Status: DC | PRN
  Administered 2015-02-11: 2 g via INTRAVENOUS

## 2015-02-11 MED ORDER — FENTANYL CITRATE (PF) 250 MCG/5ML IJ SOLN
INTRAMUSCULAR | Status: DC | PRN
  Administered 2015-02-11 (×3): 50 ug via INTRAVENOUS

## 2015-02-11 MED ORDER — FENTANYL CITRATE (PF) 250 MCG/5ML IJ SOLN
INTRAMUSCULAR | Status: AC
  Filled 2015-02-11: qty 5

## 2015-02-11 MED ORDER — DEXMEDETOMIDINE BOLUS VIA INFUSION
INTRAVENOUS | Status: DC | PRN
  Administered 2015-02-11 (×3): 4 ug via INTRAVENOUS

## 2015-02-11 MED ORDER — CEFAZOLIN SODIUM-DEXTROSE 2-3 GM-% IV SOLR
INTRAVENOUS | Status: AC
  Filled 2015-02-11: qty 50

## 2015-02-11 MED ORDER — HYDROMORPHONE HCL 1 MG/ML IJ SOLN
INTRAMUSCULAR | Status: AC
  Filled 2015-02-11: qty 1

## 2015-02-11 MED ORDER — ONDANSETRON HCL 4 MG/2ML IJ SOLN: 4.0000 mg | Freq: Once | INTRAMUSCULAR | Status: DC | PRN

## 2015-02-11 MED ORDER — DEXMEDETOMIDINE HCL IN NACL 200 MCG/50ML IV SOLN
INTRAVENOUS | Status: AC
  Filled 2015-02-11: qty 50

## 2015-02-11 MED ORDER — OXYCODONE HCL 5 MG PO TABS: 5.0000 mg | ORAL_TABLET | Freq: Once | ORAL | Status: DC | PRN

## 2015-02-11 MED ORDER — LACTATED RINGERS IV SOLN
INTRAVENOUS | Status: DC | PRN
  Administered 2015-02-11: 19:00:00 via INTRAVENOUS

## 2015-02-11 SURGICAL SUPPLY — 35 items
BLADE CLIPPER SURG (BLADE) IMPLANT
BLADE SURG 15 STRL LF DISP TIS (BLADE) ×1 IMPLANT
BLADE SURG 15 STRL SS (BLADE) ×2
DRAIN SUBARACHNOID (WOUND CARE) ×3 IMPLANT
DRAPE C-ARM 42X72 X-RAY (DRAPES) ×3 IMPLANT
DRAPE LAPAROTOMY 100X72X124 (DRAPES) ×3 IMPLANT
DRAPE PROXIMA HALF (DRAPES) ×3 IMPLANT
DRAPE SURG 17X23 STRL (DRAPES) ×3 IMPLANT
DRSG OPSITE POSTOP 3X4 (GAUZE/BANDAGES/DRESSINGS) ×9 IMPLANT
DURAPREP 26ML APPLICATOR (WOUND CARE) ×3 IMPLANT
GAUZE SPONGE 4X4 16PLY XRAY LF (GAUZE/BANDAGES/DRESSINGS) ×3 IMPLANT
GLOVE BIOGEL PI IND STRL 7.5 (GLOVE) ×1 IMPLANT
GLOVE BIOGEL PI INDICATOR 7.5 (GLOVE) ×2
GLOVE ECLIPSE 7.0 STRL STRAW (GLOVE) ×3 IMPLANT
GLOVE EXAM NITRILE LRG STRL (GLOVE) IMPLANT
GLOVE EXAM NITRILE MD LF STRL (GLOVE) IMPLANT
GLOVE EXAM NITRILE XL STR (GLOVE) IMPLANT
GLOVE EXAM NITRILE XS STR PU (GLOVE) IMPLANT
GOWN STRL REUS W/ TWL LRG LVL3 (GOWN DISPOSABLE) ×2 IMPLANT
GOWN STRL REUS W/ TWL XL LVL3 (GOWN DISPOSABLE) IMPLANT
GOWN STRL REUS W/TWL 2XL LVL3 (GOWN DISPOSABLE) IMPLANT
GOWN STRL REUS W/TWL LRG LVL3 (GOWN DISPOSABLE) ×4
GOWN STRL REUS W/TWL XL LVL3 (GOWN DISPOSABLE)
KIT BASIN OR (CUSTOM PROCEDURE TRAY) ×3 IMPLANT
KIT ROOM TURNOVER OR (KITS) ×3 IMPLANT
LIQUID BAND (GAUZE/BANDAGES/DRESSINGS) ×3 IMPLANT
NEEDLE HYPO 25X1 1.5 SAFETY (NEEDLE) ×6 IMPLANT
NS IRRIG 1000ML POUR BTL (IV SOLUTION) ×3 IMPLANT
PACK SURGICAL SETUP 50X90 (CUSTOM PROCEDURE TRAY) ×3 IMPLANT
PAD ARMBOARD 7.5X6 YLW CONV (MISCELLANEOUS) ×9 IMPLANT
SPECIMEN JAR SMALL (MISCELLANEOUS) IMPLANT
SUT VICRYL 3-0 RB1 18 ABS (SUTURE) IMPLANT
SYR CONTROL 10ML LL (SYRINGE) ×9 IMPLANT
TOWEL OR 17X24 6PK STRL BLUE (TOWEL DISPOSABLE) ×3 IMPLANT
TOWEL OR 17X26 10 PK STRL BLUE (TOWEL DISPOSABLE) ×3 IMPLANT

## 2015-02-11 NOTE — H&P (Addendum)
CC:  Chief Complaint  Patient presents with  . Post-op Problem    HPI: James Hays is a 64 y.o. male transferred to [facility] after presenting to [facility] with wound drainage. He underwent right L3-4 microdiscectomy at the outpatient surgical center about a week and a half ago by Dr. [name]. He says day before yesterday he noted that his clothes were saturated with fluid, and after he went to bed he noted his bedsheets were soaked. He describes the fluid as thin, mostly clear. Yesterday, he had fairly sudden onset of HA with continued drainage. HA was worsened when he sat up, and definitely worse with coughing. He therefore presented to the ED at [facility] and was transferred here for further care.  PMH: Past Medical History  Diagnosis Date  . Hyperlipidemia     PSH: Past Surgical History  Procedure Laterality Date  . Vasectomy    . Fracture surgery      L foot  . Hydrocele excision    . Hernia repair      inguinal and umbilical  . Back surgery      SH: Social History  Substance Use Topics  . Smoking status: Current Every Day Smoker -- 1.00 packs/day    Types: Cigarettes  . Smokeless tobacco: Never Used  . Alcohol Use: No    MEDS: Prior to Admission medications   Medication Sig Start Date End Date Taking? Authorizing Provider  cholecalciferol (VITAMIN D) 1000 UNITS tablet Take 1,000 Units by mouth daily.   Yes Historical Provider, MD  diazepam (VALIUM) 5 MG tablet Take 5-10 mg by mouth every 6 (six) hours as needed. 02/01/15  Yes Historical Provider, MD  oxyCODONE-acetaminophen (ROXICET) 5-325 MG per tablet Take 2 tablets by mouth every 4 (four) hours as needed for severe pain. 01/23/15  Yes [name], MD  tadalafil (CIALIS) 10 MG tablet Take 10 mg by mouth daily as needed for erectile dysfunction.   Yes Historical Provider, MD  aspirin 81 MG tablet Take 81 mg by mouth daily.    Historical Provider, MD  predniSONE (DELTASONE) 50 MG tablet 1 tablet daily for 7 days Patient not  taking: Reported on 02/10/2015 01/17/15   [name], MD    ALLERGY: No Known Allergies  ROS: ROS  NEUROLOGIC EXAM: Awake, alert, oriented Memory and concentration grossly intact Speech fluent, appropriate CN grossly intact Motor exam: Upper Extremities Deltoid Bicep Tricep Grip  Right 5/5 5/5 5/5 5/5  Left 5/5 5/5 5/5 5/5   Lower Extremity IP Quad PF DF EHL  Right 5/5 5/5 5/5 5/5 5/5  Left 5/5 5/5 5/5 5/5 5/5   Sensation grossly intact to LT Wound non-tender, no significant erythema. There is an ~47mm portion of the superior aspect of the wound from which thin fluid is draining.   IMPRESSION: - 64 y.o. male s/p lumbar decompression with likely postoperative CSF leak.  PLAN: - Pt to remain flat in bed  - If CSF leak persists despite being flat, may require lumbar drainage.

## 2015-02-11 NOTE — Progress Notes (Signed)
Patient transferred to neuro OR. Patient has wallet, glasses, cell phone in belongings. Patient stated he wanted to keep in room until given other room, RN put belongings including wallet, glasses, cell phone in utility room, patient educated about belongings policy. Patient's wife aware that belongings are on 4N floor.

## 2015-02-11 NOTE — Progress Notes (Signed)
1 of 2 blood cultures taken at [facility] yesterday has grown GPC in clusters. The second remains negative. I suspect this may be contaminant as the patient does not appear toxic, is afebrile, with minimal leukocytosis and essentially normal vital signs. Will hold off on starting empiric abx and will repeat blood cultures x2.

## 2015-02-11 NOTE — Progress Notes (Signed)
Pt arrived to unit via [facility]. Pt oriented to room, staff and plan of care.  MD notified of pts arrival.  Pts old incision site currently dry but with redness. Flat bedrest orders initiated.  Will continue to monitor

## 2015-02-11 NOTE — Anesthesia Preprocedure Evaluation (Addendum)
Anesthesia Evaluation  Patient identified by MRN, date of birth, ID band Patient awake    Reviewed: Allergy & Precautions, NPO status , Patient's Chart, lab work & pertinent test results  Airway Mallampati: II  TM Distance: >3 FB Neck ROM: Full    Dental  (+) Edentulous Upper, Edentulous Lower, Dental Advisory Given   Pulmonary Current Smoker,  breath sounds clear to auscultation        Cardiovascular Rhythm:Regular Rate:Normal     Neuro/Psych    GI/Hepatic   Endo/Other    Renal/GU      Musculoskeletal   Abdominal   Peds  Hematology   Anesthesia Other Findings   Reproductive/Obstetrics                           Anesthesia Physical Anesthesia Plan  ASA: II  Anesthesia Plan: MAC   Post-op Pain Management:    Induction: Intravenous  Airway Management Planned: Natural Airway and Nasal Cannula  Additional Equipment:   Intra-op Plan:   Post-operative Plan:   Informed Consent:   Dental advisory given  Plan Discussed with: CRNA, Anesthesiologist and Surgeon  Anesthesia Plan Comments:        Anesthesia Quick Evaluation

## 2015-02-11 NOTE — Anesthesia Postprocedure Evaluation (Signed)
  Anesthesia Post-op Note  Patient: James Hays  Procedure(s) Performed: Procedure(s): PLACEMENT OF LUMBAR DRAIN (N/A)  Patient Location: PACU  Anesthesia Type: MAC  Level of Consciousness: awake, alert  and oriented  Airway and Oxygen Therapy: Patient Spontanous Breathing  Post-op Pain: mild  Post-op Assessment: Post-op Vital signs reviewed, Patient's Cardiovascular Status Stable, Respiratory Function Stable, Patent Airway and Pain level controlled   LLE Sensation: Full sensation, No numbness, No pain, No tingling   RLE Sensation: Full sensation, No numbness, No pain, No tingling      Post-op Vital Signs: stable  Last Vitals:  Filed Vitals:   02/11/15 1954  BP: 115/69  Pulse: 95  Temp: 36.7 C  Resp: 20    Complications: No apparent anesthesia complications

## 2015-02-11 NOTE — Op Note (Signed)
PREOP DIAGNOSIS:  1. Postoperative CSF Leak   POSTOP DIAGNOSIS: Same  PROCEDURE: 1. Lumbar drain placement  SURGEON: Dr. [name], MD  ASSISTANT: None  ANESTHESIA: IV sedation with local  EBL: Minimal  SPECIMENS: None  DRAINS: Lumbar drain  COMPLICATIONS: None  CONDITION: Stable to PACU  HISTORY: James Hays is a 64 y.o. male who underwent right L3-4 microdiscectomy about 10 days ago and presented yesterday with clear wound drainage and postural headache. Lumbar drain was therefore indicated.  PROCEDURE IN DETAIL: After informed consent was obtained and witnessed, the patient was brought to the operating room. After he was sufficiently sedated, he was turned into the prone position and all pressure points were padded.   After timeout was conducted, [facility] fluoroscopy was used to confirm the surface projection of the interlaminar space in the lower lumbar spine. This area was then infiltrated with local anesthetic. Tuohy needle was then introduced into the thecal sac under fluoroscopic guidance. A stylette was removed and CSF was obtained. The lumbar drain catheter was then introduced and advanced to approximately 15 cm. Good flow of CSF from the distal end of the catheter was observed. The Touhy needle was then removed. The drain was then secured to the skin. The patient tolerated the procedure well and was returned to the postanesthesia care unit in stable hemodynamic condition.

## 2015-02-11 NOTE — Progress Notes (Signed)
Per [facility] lab, blood culture results show gram positive cocci. MD aware. Bed, linen, patient gown continues to get saturated from leak. Patient remains on flat bed rest. Patient changed, linen changed, dressing changed. MD aware that leak continues to saturate dressing, linen. Per MD, keep patient NPO. Patient is NPO, aware. Call bell within patient's reach. Will continue to monitor.

## 2015-02-11 NOTE — Transfer of Care (Signed)
Immediate Anesthesia Transfer of Care Note  Patient: James Hays  Procedure(s) Performed: Procedure(s): PLACEMENT OF LUMBAR DRAIN (N/A)  Patient Location: PACU  Anesthesia Type:MAC  Level of Consciousness: awake and alert   Airway & Oxygen Therapy: Patient Spontanous Breathing and Patient connected to nasal cannula oxygen  Post-op Assessment: Report given to RN and Post -op Vital signs reviewed and stable  Post vital signs: Reviewed and stable  Last Vitals:  Filed Vitals:   02/11/15 1806  BP: 108/53  Pulse: 89  Temp: 37.2 C  Resp: 20    Complications: No apparent anesthesia complications

## 2015-02-11 NOTE — Progress Notes (Signed)
Despite continued bedrest, pt has been leaking copious amounts of CSF from his wound, saturating gowns and bed linens throughout the day. I have therefore recommended proceeding with lumbar drain placement. Risks and benefits were reviewed, and after all questions were answered he provided consent.

## 2015-02-12 DIAGNOSIS — Z9889 Other specified postprocedural states: Secondary | ICD-10-CM

## 2015-02-12 DIAGNOSIS — Z419 Encounter for procedure for purposes other than remedying health state, unspecified: Secondary | ICD-10-CM

## 2015-02-12 DIAGNOSIS — G9782 Other postprocedural complications and disorders of nervous system: Principal | ICD-10-CM

## 2015-02-12 DIAGNOSIS — R7881 Bacteremia: Secondary | ICD-10-CM | POA: Diagnosis present

## 2015-02-12 DIAGNOSIS — B9561 Methicillin susceptible Staphylococcus aureus infection as the cause of diseases classified elsewhere: Secondary | ICD-10-CM

## 2015-02-12 LAB — MRSA PCR SCREENING: MRSA by PCR: NEGATIVE

## 2015-02-12 MED ORDER — OXYCODONE-ACETAMINOPHEN 5-325 MG PO TABS
1.0000 | ORAL_TABLET | ORAL | Status: DC | PRN
  Administered 2015-02-12 (×2): 1 via ORAL
  Administered 2015-02-13: 2 via ORAL
  Administered 2015-02-13: 1 via ORAL
  Administered 2015-02-13 – 2015-02-15 (×6): 2 via ORAL
  Administered 2015-02-15: 1 via ORAL
  Administered 2015-02-15 – 2015-02-18 (×10): 2 via ORAL
  Administered 2015-02-19 (×4): 1 via ORAL
  Administered 2015-02-19: 2 via ORAL
  Administered 2015-02-20 (×2): 1 via ORAL
  Administered 2015-02-20 – 2015-02-21 (×2): 2 via ORAL
  Administered 2015-02-22 – 2015-02-23 (×2): 1 via ORAL
  Administered 2015-02-24: 2 via ORAL
  Filled 2015-02-12 (×5): qty 2
  Filled 2015-02-12 (×2): qty 1
  Filled 2015-02-12 (×4): qty 2
  Filled 2015-02-12 (×2): qty 1
  Filled 2015-02-12: qty 2
  Filled 2015-02-12: qty 1
  Filled 2015-02-12 (×4): qty 2
  Filled 2015-02-12: qty 1
  Filled 2015-02-12 (×2): qty 2
  Filled 2015-02-12 (×2): qty 1
  Filled 2015-02-12 (×2): qty 2
  Filled 2015-02-12: qty 1
  Filled 2015-02-12 (×4): qty 2
  Filled 2015-02-12: qty 1

## 2015-02-12 MED ORDER — VANCOMYCIN HCL 10 G IV SOLR
1250.0000 mg | Freq: Once | INTRAVENOUS | Status: AC
  Administered 2015-02-12: 1250 mg via INTRAVENOUS
  Filled 2015-02-12: qty 1250

## 2015-02-12 MED ORDER — VANCOMYCIN HCL IN DEXTROSE 1-5 GM/200ML-% IV SOLN
1000.0000 mg | Freq: Two times a day (BID) | INTRAVENOUS | Status: DC
  Administered 2015-02-13: 1000 mg via INTRAVENOUS
  Filled 2015-02-12 (×2): qty 200

## 2015-02-12 MED ORDER — CEFAZOLIN SODIUM 1-5 GM-% IV SOLN
1.0000 g | Freq: Three times a day (TID) | INTRAVENOUS | Status: DC
  Administered 2015-02-12: 1 g via INTRAVENOUS
  Filled 2015-02-12 (×2): qty 50

## 2015-02-12 MED ORDER — CEFTRIAXONE SODIUM 2 G IJ SOLR
2.0000 g | INTRAMUSCULAR | Status: DC
  Administered 2015-02-12: 2 g via INTRAVENOUS
  Filled 2015-02-12 (×2): qty 2

## 2015-02-12 NOTE — Consult Note (Signed)
[facility] for Infectious Disease    Date of Admission:  02/10/2015  Date of Consult:  02/12/2015  Reason for Consult: staphylococcus aureus bacteremia Referring Physician: Auto-consult for Staphylococcus Aureus per polic adopted by [facility], and also consult for Dr. Kathyrn Sheriff   HPI: James Hays is an 64 y.o. male who underwent L3-4 microdiscectomy at the outpatient surgical center about a week and a half ago by Dr. [name]. He had been doing well until 2 days prior to admission when he began noticing that his close were soaked with fluid that was coming from his surgical site. He described the fluid to me as initially having some whitish material in it but later largely clear occasionally with some blood in it. Several bedsheets were being soaked through and he was developing a severe headache. Headache worse when he sat up and with coughing and movement. He came to the emerge department [facility] was also doubly transferred to [facility] for further care. He's been diagnosed with a postoperative CSF leak and has had placement of a lumbar drain.  At [facility] department he had blood cultures obtained in one of the 2 blood cultures is positive now for Staphylococcus aureus.while he does not appear to have been febrile at [facility] he has had temperatures up to 101 here at [facility].   I was notified via email this morning when the [facility] system flaged ID pharmacists and ID physicians to the positive culture and per [place] I engaged with patient, examined the chart and intiated antibiotics with vancomycin and cefazolin (now changed to rocephin)      Past Medical History  Diagnosis Date  . Hyperlipidemia     Past Surgical History  Procedure Laterality Date  . Vasectomy    . Fracture surgery      L foot  . Hydrocele excision    . Hernia repair      inguinal and umbilical  . Back surgery    ergies:   No Known Allergies   Medications: I  have reviewed patients current medications as documented in Epic Anti-infectives    Start     Dose/Rate Route Frequency Ordered Stop   02/13/15 0100  vancomycin (VANCOCIN) IVPB 1000 mg/200 mL premix     1,000 mg 200 mL/hr over 60 Minutes Intravenous Every 12 hours 02/12/15 1125     02/12/15 1400  cefTRIAXone (ROCEPHIN) 2 g in dextrose 5 % 50 mL IVPB     2 g 100 mL/hr over 30 Minutes Intravenous Every 24 hours 02/12/15 1346     02/12/15 1200  ceFAZolin (ANCEF) IVPB 1 g/50 mL premix  Status:  Discontinued     1 g 100 mL/hr over 30 Minutes Intravenous 3 times per day 02/12/15 1125 02/12/15 1346   02/12/15 1130  vancomycin (VANCOCIN) 1,250 mg in sodium chloride 0.9 % 250 mL IVPB     1,250 mg 166.7 mL/hr over 90 Minutes Intravenous  Once 02/12/15 1125 02/12/15 1410      Social History:  reports that he has been smoking Cigarettes.  He has been smoking about 1.00 pack per day. He has never used smokeless tobacco. He reports that he does not drink alcohol or use illicit drugs.  Family History  Problem Relation Age of Onset  . Hypertension Mother   . Diabetes Mother   . Heart disease Father 22  . Hyperlipidemia Father   . Diabetes Brother  As in HPI and primary teams notes otherwise 12 point review of systems is negative  Blood pressure 111/71, pulse 85, temperature 100.8 F (38.2 C), temperature source Oral, resp. rate 13, height 5\' 8"  (1.727 m), weight 170 lb 10.2 oz (77.4 kg), SpO2 96 %. General: Alert and awake, oriented x3,but sleepy "because they have me doped up" HEENT: anicteric sclera, , EOMI,  CVS regular rate, normal r,  no murmur rubs or gallops Chest: clear to auscultation bilaterally, no wheezing, rales or rhonchi Abdomen: soft nontender, nondistended, normal bowel sounds, Extremities: no  clubbing or edema noted bilaterally Skin: lumbar wound dressed and drain with clear CSF Neuro: nonfocal, strength and sensation intact   Results for orders placed or performed  during the hospital encounter of 02/10/15 (from the past 48 hour(s))  CBC with Differential     Status: Abnormal   Collection Time: 02/10/15  7:29 PM  Result Value Ref Range   WBC 12.2 (H) 4.0 - 10.5 K/uL   RBC 4.70 4.22 - 5.81 MIL/uL   Hemoglobin 15.0 13.0 - 17.0 g/dL   HCT 44.6 39.0 - 52.0 %   MCV 94.9 78.0 - 100.0 fL   MCH 31.9 26.0 - 34.0 pg   MCHC 33.6 30.0 - 36.0 g/dL   RDW 13.6 11.5 - 15.5 %   Platelets 304 150 - 400 K/uL   Neutrophils Relative % 81 (H) 43 - 77 %   Neutro Abs 9.9 (H) 1.7 - 7.7 K/uL   Lymphocytes Relative 11 (L) 12 - 46 %   Lymphs Abs 1.3 0.7 - 4.0 K/uL   Monocytes Relative 8 3 - 12 %   Monocytes Absolute 1.0 0.1 - 1.0 K/uL   Eosinophils Relative 0 0 - 5 %   Eosinophils Absolute 0.0 0.0 - 0.7 K/uL   Basophils Relative 0 0 - 1 %   Basophils Absolute 0.0 0.0 - 0.1 K/uL  Basic metabolic panel     Status: Abnormal   Collection Time: 02/10/15  7:29 PM  Result Value Ref Range   Sodium 135 135 - 145 mmol/L   Potassium 4.0 3.5 - 5.1 mmol/L   Chloride 102 101 - 111 mmol/L   CO2 22 22 - 32 mmol/L   Glucose, Bld 129 (H) 65 - 99 mg/dL   BUN 11 6 - 20 mg/dL   Creatinine, Ser 1.05 0.61 - 1.24 mg/dL   Calcium 11.2 (H) 8.9 - 10.3 mg/dL   GFR calc non Af Amer >60 >60 mL/min   GFR calc Af Amer >60 >60 mL/min    Comment: (NOTE) The eGFR has been calculated using the CKD EPI equation. This calculation has not been validated in all clinical situations. eGFR's persistently <60 mL/min signify possible Chronic Kidney Disease.    Anion gap 11 5 - 15  Sedimentation rate     Status: Abnormal   Collection Time: 02/10/15  7:29 PM  Result Value Ref Range   Sed Rate 95 (H) 0 - 16 mm/hr  Blood culture (routine x 2)     Status: None (Preliminary result)   Collection Time: 02/10/15  7:37 PM  Result Value Ref Range   Specimen Description BLOOD LEFT ARM    Special Requests BOTTLES DRAWN AEROBIC AND ANAEROBIC 6CC    Culture  Setup Time      GRAM POSITIVE COCCI IN  CLUSTERS ANAEROBIC BOTTLE ONLY CRITICAL RESULT CALLED TO, READ BACK BY AND VERIFIED WITH: J.CARMICHAEL,RN 02/11/15 @2135  BY V.WILKINS CONFIRMED BY T.C[place] Gram Stain Report Called  to,Read Back By and Verified With: [place] [id number] ON [id number] BY THOMPSON S.    Culture      STAPHYLOCOCCUS AUREUS Performed at [facility]    Report Status PENDING   Blood culture (routine x 2)     Status: None (Preliminary result)   Collection Time: 02/10/15  7:40 PM  Result Value Ref Range   Specimen Description BLOOD LEFT HAND    Special Requests BOTTLES DRAWN AEROBIC AND ANAEROBIC [place]    Culture NO GROWTH 2 DAYS    Report Status PENDING   Glucose, capillary     Status: None   Collection Time: 02/11/15  6:16 PM  Result Value Ref Range   Glucose-Capillary 90 65 - 99 mg/dL  MRSA PCR Screening     Status: None   Collection Time: 02/11/15  9:47 PM  Result Value Ref Range   MRSA by PCR NEGATIVE NEGATIVE    Comment:        The GeneXpert MRSA Assay (FDA approved for NASAL specimens only), is one component of a comprehensive MRSA colonization surveillance program. It is not intended to diagnose MRSA infection nor to guide or monitor treatment for MRSA infections.    @BRIEFLABTABLE (sdes,specrequest,cult,reptstatus)   ) Recent Results (from the past 720 hour(s))  Blood culture (routine x 2)     Status: None (Preliminary result)   Collection Time: 02/10/15  7:37 PM  Result Value Ref Range Status   Specimen Description BLOOD LEFT ARM  Final   Special Requests BOTTLES DRAWN AEROBIC AND ANAEROBIC 6CC  Final   Culture  Setup Time   Final    GRAM POSITIVE COCCI IN CLUSTERS ANAEROBIC BOTTLE ONLY CRITICAL RESULT CALLED TO, READ BACK BY AND VERIFIED WITH: J.CARMICHAEL,RN 02/11/15 @2135  BY V.WILKINS CONFIRMED BY T.C[place] Gram Stain Report Called to,Read Back By and Verified With: [place] K. AT 4536 ON [id number] BY THOMPSON S.    Culture   Final    STAPHYLOCOCCUS AUREUS Performed at  [facility]    Report Status PENDING  Incomplete  Blood culture (routine x 2)     Status: None (Preliminary result)   Collection Time: 02/10/15  7:40 PM  Result Value Ref Range Status   Specimen Description BLOOD LEFT HAND  Final   Special Requests BOTTLES DRAWN AEROBIC AND ANAEROBIC 6CC  Final   Culture NO GROWTH 2 DAYS  Final   Report Status PENDING  Incomplete  MRSA PCR Screening     Status: None   Collection Time: 02/11/15  9:47 PM  Result Value Ref Range Status   MRSA by PCR NEGATIVE NEGATIVE Final    Comment:        The GeneXpert MRSA Assay (FDA approved for NASAL specimens only), is one component of a comprehensive MRSA colonization surveillance program. It is not intended to diagnose MRSA infection nor to guide or monitor treatment for MRSA infections.      Impression/Recommendation  Active Problems:   Post-operative complication   Postoperative CSF leak   James Hays is a 64 y.o. male with  Postoperative CSF leak and positive blood culture for Staphylococcus Aureus   #1. Staphylococcus aureus bacteremia: We in Infectious Disease take even 1 positive blood culture = genuine bacteremia.  I can cite numerous examples including several active ID cases of severe SAB that were only picked up by 1/2 blood cultures and this is also well documented in the literature.  Coag negative staph, Viridans being only 1/2 I WOULD regard as contaminant  but not SAB and the identity of this SAB was only made known today around noon       [place] Antimicrobial Management Team Staphylococcus aureus bacteremia   Staphylococcus aureus bacteremia (SAB) is associated with a high rate of complications and mortality.  Specific aspects of clinical management are critical to optimizing the outcome of patients with SAB.  Therefore, the [facility] Antimicrobial Management Team [facility]) has initiated an intervention aimed at improving the management of SAB at [facility].  To do  so, Infectious Diseases physicians are providing an evidence-based consult for the management of all patients with SAB.     Yes No Comments  Perform follow-up blood cultures (even if the patient is afebrile) to ensure clearance of bacteremia $RemoveBefor'[x]'rXlGKreVxvHL$'[]'$  Will check tomorrow after he has been on abx first, he has another set  "pre abx"  Remove vascular catheter and obtain follow-up blood cultures after the removal of the catheter $RemoveBefo'[]'pMEQwXxkety$'[]'$  NO central lines  Perform echocardiography to evaluate for endocarditis (transthoracic ECHO is 40-50% sensitive, TEE is > 90% sensitive) $RemoveBefore'[]'XYdHteuzCUgCb$'[]'$  Please keep in mind, that neither test can definitively EXCLUDE endocarditis, and that should clinical suspicion remain high for endocarditis the patient should then still be treated with an "endocarditis" duration of therapy = 6 weeks  TTE ordered  Consult electrophysiologist to evaluate implanted cardiac device (pacemaker, ICD) $RemoveBeforeDE'[]'nhbZbNTXHRyxDDL$'[]'$  NA  Ensure source control $RemoveBefore'[]'ILbxdtQZBtGZO$'[]'$  Have all abscesses been drained effectively? Have deep seeded infections (septic joints or osteomyelitis) had appropriate surgical debridement?   I AM WORRIED ABOUT HIS OPERATIVE SITE SINCE IT IS THE LARGEST POTENTIAL PORTAL FOR A SKIN 'BUG" LIKE STAPH AUREUS TO HAVE ENTERED AND IT IS A DEEP OPERATIVE SITE  WHILE I DOUBT HE COULD HAVE OSTEOMYELITIS AND FLUID SEEMS LARGELY TO BE CSF AND NO PURULENCE AT THIS POINT I WOULD FAVOR AN MRI OR CT OF THE LUMBAR SPINE TO ELUCIDATE CSF AND OTHER POTENTIAL FLUID COLLECTIONS ABNROMALITIES KEEPING IN MIND HOW SENSITIVE AN MRI CAN BE IN POSTOP SETTING IN PARTICULAR   Investigate for "metastatic" sites of infection $RemoveBefo'[]'AcslQKKTOBX$'[]'$  Does the patient have ANY symptom or physical exam finding that would suggest a deeper infection (back or neck pain that may be suggestive of vertebral osteomyelitis or epidural abscess, muscle pain that could be a symptom of pyomyositis)?  Keep in mind that for deep seeded infections MRI imaging with contrast is  preferred rather than other often insensitive tests such as plain x-rays, especially early in a patient's presentation.  Change antibiotic therapy to VANCOMYCIN AND CEFTRIAXONE $RemoveBeforeD'[x]'AEZsaRsbTmwNRG$'[]'$  Beta-lactam antibiotics are preferred for MSSA due to higher cure rates.   If on Vancomycin, goal trough should be 15 - 20 mcg/mL  Estimated duration of IV antibiotic therapy:  6 WEEKS to 8 weeks likely $RemoveBefo'[]'jNIMNvsFsFk$'[]'$  Consult case management for probably prolonged outpatient IV antibiotic therapy   I have left my cell phone in 3[facility] so that Dr Kathyrn Sheriff can give me a call and we can discuss further in particular potentially imaging the L spine  02/12/2015, 4:01 PM   Thank you so much for this interesting consult  [place] [facility] [place] [phone] (pager) [phone] (office) 02/12/2015, 4:01 PM  [place] 02/12/2015, 4:01 PM

## 2015-02-12 NOTE — Progress Notes (Signed)
Pt seen and examined. No issues overnight. Pt has back soreness from lying flat. Otherwise no new c/o.  EXAM: Temp:  [98.1 F (36.7 C)-100.7 F (38.2 C)] 99.5 F (37.5 C) (08/14 0400) Pulse Rate:  [77-115] 87 (08/14 0700) Resp:  [10-26] 12 (08/14 0700) BP: (91-137)/(49-84) 100/66 mmHg (08/14 0700) SpO2:  [88 %-99 %] 99 % (08/14 0700) FiO2 (%):  [28 %-40 %] 40 % (08/14 0240) Weight:  [77.4 kg (170 lb 10.2 oz)] 77.4 kg (170 lb 10.2 oz) (08/13 2138) Intake/Output      08/13 0701 - 08/14 0700 08/14 0701 - 08/15 0700   I.V. (mL/kg) 1450 (18.7)    Total Intake(mL/kg) 1450 (18.7)    Urine (mL/kg/hr) 1350 (0.7)    Drains 81 (0) 0 (0)   Blood 10 (0)    Total Output 1441 0   Net +9 0         Awake, alert, oriented CN intact Moving BLE well Drain in place, dressings are partially saturated with CSF  LABS: Lab Results  Component Value Date   CREATININE 1.05 02/10/2015   BUN 11 02/10/2015   NA 135 02/10/2015   K 4.0 02/10/2015   CL 102 02/10/2015   CO2 22 02/10/2015   Lab Results  Component Value Date   WBC 12.2* 02/10/2015   HGB 15.0 02/10/2015   HCT 44.6 02/10/2015   MCV 94.9 02/10/2015   PLT 304 02/10/2015    IMPRESSION: - 64 y.o. male s/p L3-4 microdisc and postop CSF leak - LD in place - 1/2 blood cultures from ED positive, may be contaminant?  PLAN: - Will increased LD drainage to 15Q2 - Repeat blood cultures from yesterday pending

## 2015-02-12 NOTE — Progress Notes (Addendum)
INFECTIOUS DISEASE ATTENDING ADDENDUM:              [place] for Infectious Disease    THIS IS TO CLARIFY WHY I HAVE ORDERED ANTIBIOTICS AND TEST ON THIS PATIENT WITHOUT FORMAL CONSULATIONS  The [place] [place] passed policy approximately over 2 years agoe whereby   INFECTIOUS DISEASE AUTOMATICALLY IS CONSULTED FOR  ALL STAPHYLOCOCCUS AUREUS BACTEREMIA'S without need for phone call from the primary team  SAB is a very lethal bacteremia and needs to be treated by ID.  I realize that this patient has SAB in 1/2 blood cultures but it has been my personal experience and it is also bourne out by the literature that Twining TO be considered REAL and worked up accordingly  I DO PLAN to see this patient FORMALLY later today but I was trying to respond in REAL TIME to this patient's bacteremia and did so as soon as a received an email alert to his +blood cultures  As an aside we also in ID do also  "AUTO -CONSULTS" on every patient with Enterococcus , Candida in ANY BLOOD CULTURE and all HIV + tests   Rhina Brackett [place] 02/12/2015, 11:55 AM

## 2015-02-12 NOTE — Progress Notes (Signed)
ANTIBIOTIC CONSULT NOTE - INITIAL  Pharmacy Consult:  Vancomycin / Ancef Indication:  Staph bacteremia  No Known Allergies  Patient Measurements: Height: 5\' 8"  (172.7 cm) Weight: 170 lb 10.2 oz (77.4 kg) IBW/kg (Calculated) : 68.4  Vital Signs: Temp: 97.6 F (36.4 C) (08/14 0800) Temp Source: Axillary (08/14 0800) BP: 121/76 mmHg (08/14 1100) Pulse Rate: 101 (08/14 1100) Intake/Output from previous day: 08/13 0701 - 08/14 0700 In: 1450 [I.V.:1450] Out: 1441 [Urine:1350; Drains:81; Blood:10] Intake/Output from this shift: Total I/O In: 300 [I.V.:300] Out: 25 [Drains:25]  Labs:  Recent Labs  02/10/15 1929  WBC 12.2*  HGB 15.0  PLT 304  CREATININE 1.05   Estimated Creatinine Clearance: 70.6 mL/min (by C-G formula based on Cr of 1.05). No results for input(s): VANCOTROUGH, VANCOPEAK, VANCORANDOM, GENTTROUGH, GENTPEAK, GENTRANDOM, TOBRATROUGH, TOBRAPEAK, TOBRARND, AMIKACINPEAK, AMIKACINTROU, AMIKACIN in the last 72 hours.   Microbiology: Recent Results (from the past 720 hour(s))  Blood culture (routine x 2)     Status: None (Preliminary result)   Collection Time: 02/10/15  7:37 PM  Result Value Ref Range Status   Specimen Description BLOOD LEFT ARM  Final   Special Requests BOTTLES DRAWN AEROBIC AND ANAEROBIC 6CC  Final   Culture  Setup Time   Final    GRAM POSITIVE COCCI IN CLUSTERS ANAEROBIC BOTTLE ONLY CRITICAL RESULT CALLED TO, READ BACK BY AND VERIFIED WITH: J.CARMICHAEL,RN 02/11/15 @2135  BY V.WILKINS CONFIRMED BY T.C[place] Gram Stain Report Called to,Read Back By and Verified With: [place] K. AT [id number] ON [id number] BY THOMPSON S.    Culture   Final    STAPHYLOCOCCUS AUREUS Performed at [facility]    Report Status PENDING  Incomplete  Blood culture (routine x 2)     Status: None (Preliminary result)   Collection Time: 02/10/15  7:40 PM  Result Value Ref Range Status   Specimen Description BLOOD LEFT HAND  Final   Special Requests BOTTLES DRAWN  AEROBIC AND ANAEROBIC 6CC  Final   Culture NO GROWTH 2 DAYS  Final   Report Status PENDING  Incomplete  MRSA PCR Screening     Status: None   Collection Time: 02/11/15  9:47 PM  Result Value Ref Range Status   MRSA by PCR NEGATIVE NEGATIVE Final    Comment:        The GeneXpert MRSA Assay (FDA approved for NASAL specimens only), is one component of a comprehensive MRSA colonization surveillance program. It is not intended to diagnose MRSA infection nor to guide or monitor treatment for MRSA infections.     Medical History: Past Medical History  Diagnosis Date  . Hyperlipidemia       Assessment: 57 YOM presented to [facility] with wound drainage s/p recent right L3-4 microdiscectomy.  Patient transferred to [place] for further management.  Pharmacy consulted to initiate vancomycin and cefazolin for Staph bacteremia.  Baseline labs reviewed.  Vanc 8/14 >> Ancef 8/14 >>  8/12 BCx x2 - GPC (1 of 2) 8/13 MRSA PCR - negative 8/14 BCx x2 -   Goal of Therapy:  Vancomycin trough level 15-20 mcg/ml   Plan:  - Vanc 1250mg  IV x 1, then 1gm IV Q12H - Ancef 1gm IV Q8H - Monitor renal fxn, clinical progress, vanc trough as indicated - F/U micro data to narrow abx    James Hays D. Mina Marble, PharmD, BCPS Pager:  [phone] 02/12/2015, 11:23 AM

## 2015-02-12 NOTE — Progress Notes (Signed)
Pt placed on contact isolation per order; patient and wife educated on precautions.

## 2015-02-12 NOTE — Plan of Care (Signed)
Problem: Consults Goal: Diagnosis - Spinal Surgery CSF leak s/p L3-4 microdiskectomy

## 2015-02-13 ENCOUNTER — Inpatient Hospital Stay (HOSPITAL_COMMUNITY): Payer: BLUE CROSS/BLUE SHIELD

## 2015-02-13 ENCOUNTER — Encounter (HOSPITAL_COMMUNITY): Payer: Self-pay | Admitting: Neurosurgery

## 2015-02-13 DIAGNOSIS — R509 Fever, unspecified: Secondary | ICD-10-CM

## 2015-02-13 DIAGNOSIS — G9782 Other postprocedural complications and disorders of nervous system: Secondary | ICD-10-CM | POA: Diagnosis present

## 2015-02-13 LAB — CULTURE, BLOOD (ROUTINE X 2)

## 2015-02-13 LAB — SEDIMENTATION RATE: SED RATE: 88 mm/h — AB (ref 0–16)

## 2015-02-13 LAB — CBC WITH DIFFERENTIAL/PLATELET
BASOS ABS: 0 10*3/uL (ref 0.0–0.1)
BASOS PCT: 0 % (ref 0–1)
EOS ABS: 0.1 10*3/uL (ref 0.0–0.7)
EOS PCT: 2 % (ref 0–5)
HCT: 39.6 % (ref 39.0–52.0)
Hemoglobin: 13.3 g/dL (ref 13.0–17.0)
LYMPHS PCT: 15 % (ref 12–46)
Lymphs Abs: 1.2 10*3/uL (ref 0.7–4.0)
MCH: 31.7 pg (ref 26.0–34.0)
MCHC: 33.6 g/dL (ref 30.0–36.0)
MCV: 94.3 fL (ref 78.0–100.0)
Monocytes Absolute: 0.8 10*3/uL (ref 0.1–1.0)
Monocytes Relative: 9 % (ref 3–12)
Neutro Abs: 6.1 10*3/uL (ref 1.7–7.7)
Neutrophils Relative %: 74 % (ref 43–77)
PLATELETS: 216 10*3/uL (ref 150–400)
RBC: 4.2 MIL/uL — AB (ref 4.22–5.81)
RDW: 13.2 % (ref 11.5–15.5)
WBC: 8.2 10*3/uL (ref 4.0–10.5)

## 2015-02-13 LAB — C-REACTIVE PROTEIN: CRP: 14.4 mg/dL — ABNORMAL HIGH (ref <1.0)

## 2015-02-13 MED ORDER — GADOBENATE DIMEGLUMINE 529 MG/ML IV SOLN
20.0000 mL | Freq: Once | INTRAVENOUS | Status: AC | PRN
  Administered 2015-02-13: 17 mL via INTRAVENOUS

## 2015-02-13 MED ORDER — DEXTROSE 5 % IV SOLN
2.0000 g | Freq: Two times a day (BID) | INTRAVENOUS | Status: DC
  Administered 2015-02-14: 2 g via INTRAVENOUS
  Filled 2015-02-13 (×2): qty 2

## 2015-02-13 NOTE — Progress Notes (Signed)
Patient ID: James Hays, male   DOB: 01/23/1951, 64 y.o.   MRN: [id number] Spoke with him and wife at noon time. No headache. Clear csf. No weakness. Waiting for ID input about infection and the possibility of exploration if he continues with the leak

## 2015-02-13 NOTE — Progress Notes (Signed)
  Echocardiogram 2D Echocardiogram has been performed.  Jennette Dubin 02/13/2015, 11:41 AM

## 2015-02-13 NOTE — Progress Notes (Signed)
[place] for Infectious Disease    Subjective: No new complaints   Antibiotics:  Anti-infectives    Start     Dose/Rate Route Frequency Ordered Stop   02/14/15 1500  cefTRIAXone (ROCEPHIN) 2 g in dextrose 5 % 50 mL IVPB     2 g 100 mL/hr over 30 Minutes Intravenous Every 12 hours 02/13/15 1401     02/13/15 0100  vancomycin (VANCOCIN) IVPB 1000 mg/200 mL premix  Status:  Discontinued     1,000 mg 200 mL/hr over 60 Minutes Intravenous Every 12 hours 02/12/15 1125 02/13/15 0950   02/12/15 1400  cefTRIAXone (ROCEPHIN) 2 g in dextrose 5 % 50 mL IVPB  Status:  Discontinued     2 g 100 mL/hr over 30 Minutes Intravenous Every 24 hours 02/12/15 1346 02/13/15 1401   02/12/15 1200  ceFAZolin (ANCEF) IVPB 1 g/50 mL premix  Status:  Discontinued     1 g 100 mL/hr over 30 Minutes Intravenous 3 times per day 02/12/15 1125 02/12/15 1346   02/12/15 1130  vancomycin (VANCOCIN) 1,250 mg in sodium chloride 0.9 % 250 mL IVPB     1,250 mg 166.7 mL/hr over 90 Minutes Intravenous  Once 02/12/15 1125 02/12/15 1410      Medications: Scheduled Meds: . [START ON 02/14/2015] cefTRIAXone (ROCEPHIN)  IV  2 g Intravenous Q12H  . cholecalciferol  1,000 Units Oral Daily  . heparin subcutaneous  5,000 Units Subcutaneous 3 times per day  . sodium chloride  3 mL Intravenous Q12H   Continuous Infusions: . sodium chloride 1,000 mL (02/13/15 0254)  . sodium chloride     PRN Meds:.acetaminophen **OR** acetaminophen, ALPRAZolam, diazepam, ondansetron (ZOFRAN) IV, ondansetron (ZOFRAN) IV, oxyCODONE-acetaminophen, sodium chloride    Objective: Weight change:   Intake/Output Summary (Last 24 hours) at 02/13/15 1910 Last data filed at 02/13/15 1800  Gross per 24 hour  Intake   2288 ml  Output   1555 ml  Net    733 ml   Blood pressure 107/71, pulse 85, temperature 97.8 F (36.6 C), temperature source Oral, resp. rate 17, height 5\' 8"  (1.727 m), weight 170 lb 10.2 oz (77.4 kg),  SpO2 95 %. Temp:  [97.8 F (36.6 C)-100.3 F (37.9 C)] 97.8 F (36.6 C) (08/15 1617) Pulse Rate:  [85-107] 85 (08/15 1800) Resp:  [10-18] 17 (08/15 1800) BP: (87-128)/(20-79) 107/71 mmHg (08/15 1800) SpO2:  [85 %-98 %] 95 % (08/15 1800) FiO2 (%):  [40 %] 40 % (08/15 0800)  Physical Exam: General: Alert and awake, oriented x3, not in any acute distress. HEENT: anicteric sclera, pupils reactive to light and accommodation, EOMI CVS regular rate, normal r,  no murmur rubs or gallops Chest: clear to auscultation bilaterally, no wheezing, rales or rhonchi Abdomen: soft nontender, nondistended, normal bowel sounds, Extremities: no  clubbing or edema noted bilaterally Neuro: nonfocal  CBC:     Component Value Date/Time   WBC 8.2 02/13/2015 0256   RBC 4.20* 02/13/2015 0256   HGB 13.3 02/13/2015 0256   HCT 39.6 02/13/2015 0256   PLT 216 02/13/2015 0256   MCV 94.3 02/13/2015 0256   MCH 31.7 02/13/2015 0256   MCHC 33.6 02/13/2015 0256   RDW 13.2 02/13/2015 0256   LYMPHSABS 1.2 02/13/2015 0256   MONOABS 0.8 02/13/2015 0256   EOSABS 0.1 02/13/2015 0256   BASOSABS 0.0 02/13/2015 0256       BMET  Recent Labs  02/10/15 1929  NA 135  K 4.0  CL 102  CO2 22  GLUCOSE 129*  BUN 11  CREATININE 1.05  CALCIUM 11.2*     Liver Panel  No results for input(s): PROT, ALBUMIN, AST, ALT, ALKPHOS, BILITOT, BILIDIR, IBILI in the last 72 hours.     Sedimentation Rate  Recent Labs  02/13/15 0256  ESRSEDRATE 88*   C-Reactive Protein  Recent Labs  02/13/15 0256  CRP 14.4*    Micro Results: Recent Results (from the past 720 hour(s))  Blood culture (routine x 2)     Status: None   Collection Time: 02/10/15  7:37 PM  Result Value Ref Range Status   Specimen Description BLOOD LEFT ARM  Final   Special Requests BOTTLES DRAWN AEROBIC AND ANAEROBIC 6CC  Final   Culture  Setup Time   Final    GRAM POSITIVE COCCI IN CLUSTERS ANAEROBIC BOTTLE ONLY CRITICAL RESULT CALLED TO,  READ BACK BY AND VERIFIED WITH: J.CARMICHAEL,RN 02/11/15 @2135  BY V.WILKINS CONFIRMED BY T.[place] Gram Stain Report Called to,Read Back By and Verified With: [place] K. AT [id number] ON [id number] BY THOMPSON S.    Culture   Final    STAPHYLOCOCCUS AUREUS Performed at [facility]    Report Status 02/13/2015 FINAL  Final   Organism ID, Bacteria STAPHYLOCOCCUS AUREUS  Final      Susceptibility   Staphylococcus aureus - MIC*    CIPROFLOXACIN <=0.5 SENSITIVE Sensitive     ERYTHROMYCIN <=0.25 SENSITIVE Sensitive     GENTAMICIN <=0.5 SENSITIVE Sensitive     OXACILLIN <=0.25 SENSITIVE Sensitive     TETRACYCLINE <=1 SENSITIVE Sensitive     VANCOMYCIN 1 SENSITIVE Sensitive     TRIMETH/SULFA <=10 SENSITIVE Sensitive     CLINDAMYCIN <=0.25 SENSITIVE Sensitive     RIFAMPIN <=0.5 SENSITIVE Sensitive     Inducible Clindamycin NEGATIVE Sensitive     * STAPHYLOCOCCUS AUREUS  Blood culture (routine x 2)     Status: None (Preliminary result)   Collection Time: 02/10/15  7:40 PM  Result Value Ref Range Status   Specimen Description BLOOD LEFT HAND  Final   Special Requests BOTTLES DRAWN AEROBIC AND ANAEROBIC 6CC  Final   Culture NO GROWTH 3 DAYS  Final   Report Status PENDING  Incomplete  MRSA PCR Screening     Status: None   Collection Time: 02/11/15  9:47 PM  Result Value Ref Range Status   MRSA by PCR NEGATIVE NEGATIVE Final    Comment:        The GeneXpert MRSA Assay (FDA approved for NASAL specimens only), is one component of a comprehensive MRSA colonization surveillance program. It is not intended to diagnose MRSA infection nor to guide or monitor treatment for MRSA infections.   Culture, blood (routine x 2)     Status: None (Preliminary result)   Collection Time: 02/12/15 12:50 AM  Result Value Ref Range Status   Specimen Description BLOOD LEFT ANTECUBITAL  Final   Special Requests BOTTLES DRAWN AEROBIC AND ANAEROBIC 10CC  Final   Culture NO GROWTH 1 DAY  Final   Report  Status PENDING  Incomplete  Culture, blood (routine x 2)     Status: None (Preliminary result)   Collection Time: 02/12/15 12:55 AM  Result Value Ref Range Status   Specimen Description BLOOD LEFT HAND  Final   Special Requests BOTTLES DRAWN AEROBIC ONLY 5CC  Final   Culture NO GROWTH 1 DAY  Final   Report Status PENDING  Incomplete  Studies/Results: Dg Lumbar Spine 1 View  02/11/2015   CLINICAL DATA:  64 year old male undergoing lumbar drain placement. Initial encounter.  EXAM: DG C-ARM 61-120 MIN; LUMBAR SPINE - 1 VIEW  COMPARISON:  Intraoperative lumbar images 8 /08/2014 [place] [facility], and earlier.  FINDINGS: Single fluoroscopic AP view of the lower lumbar spine. Spinal needle projects at what appears to be the L5-S1 level, left sub laminar position.  FLUOROSCOPY TIME:  0 min 11 seconds  IMPRESSION: Spinal needle projecting over the lower lumbar spine.   Electronically Signed   By: Genevie Ann M.D.   On: 02/11/2015 20:06   Dg C-arm 1-60 Min  02/11/2015   CLINICAL DATA:  64 year old male undergoing lumbar drain placement. Initial encounter.  EXAM: DG C-ARM 61-120 MIN; LUMBAR SPINE - 1 VIEW  COMPARISON:  Intraoperative lumbar images 8 /08/2014 [place] [facility], and earlier.  FINDINGS: Single fluoroscopic AP view of the lower lumbar spine. Spinal needle projects at what appears to be the L5-S1 level, left sub laminar position.  FLUOROSCOPY TIME:  0 min 11 seconds  IMPRESSION: Spinal needle projecting over the lower lumbar spine.   Electronically Signed   By: Genevie Ann M.D.   On: 02/11/2015 20:06      Assessment/Plan:  Active Problems:   Post-operative complication   Postoperative CSF leak   Surgery, other elective   S/P diskectomy   Staphylococcus aureus bacteremia    James Hays is a 64 y.o. male with   with Postoperative CSF leak and positive blood culture for Staphylococcus Aureus   #1. Staphylococcus aureus bacteremia: We in  Infectious Disease take even 1 positive blood culture = genuine bacteremia.   Drain Antimicrobial Management Team Staphylococcus aureus bacteremia   Staphylococcus aureus bacteremia (SAB) is associated with a high rate of complications and mortality. Specific aspects of clinical management are critical to optimizing the outcome of patients with SAB. Therefore, the [facility] Antimicrobial Management Team [facility]) has initiated an intervention aimed at improving the management of SAB at [facility]. To do so, Infectious Diseases physicians are providing an evidence-based consult for the management of all patients with SAB.     Yes No Comments  Perform follow-up blood cultures (even if the patient is afebrile) to ensure clearance of bacteremia [X]  [ ]  Will check tomorrow after he has been on abx first, he has another set  "pre abx"  Remove vascular catheter and obtain follow-up blood cultures after the removal of the catheter [ ]  [ ]  NO central lines  Perform echocardiography to evaluate for endocarditis (transthoracic ECHO is 40-50% sensitive, TEE is > 90% sensitive) [ ]  [ ]  Please keep in mind, that neither test can definitively EXCLUDE endocarditis, and that should clinical suspicion remain high for endocarditis the patient should then still be treated with an "endocarditis" duration of therapy = 6 weeks  TTE negative, I would NOT yet consider pursuing TEE  Consult electrophysiologist to evaluate implanted cardiac device (pacemaker, ICD) [ ]  [ ]  NA  Ensure source control [ ]  [ ]  Have all abscesses been drained effectively? Have deep seeded infections (septic joints or osteomyelitis) had appropriate surgical debridement?   I AM WORRIED ABOUT HIS OPERATIVE SITE SINCE IT IS THE LARGEST POTENTIAL PORTAL FOR A SKIN 'BUG" LIKE STAPH AUREUS TO HAVE ENTERED AND IT IS A DEEP OPERATIVE SITE  WHILE I DOUBT HE COULD HAVE OSTEOMYELITIS  AND FLUID SEEMS LARGELY TO BE CSF AND NO PURULENCE AT THIS POINT  I WOULD FAVOR AN MRI OF THE LUMBAR SPINE TO ELUCIDATE CSF AND OTHER POTENTIAL FLUID COLLECTIONS ABNROMALITIES KEEPING IN MIND HOW SENSITIVE AN MRI CAN BE IN POSTOP SETTING IN PARTICULAR  Dr. [name] has given OK for MRI and I have ordered it though study not yet done  We will have to interpret study with caution given so close to Neurosurgery   Investigate for "metastatic" sites of infection [ ]  [ ]  Does the patient have ANY symptom or physical exam finding that would suggest a deeper infection (back or neck pain that may be suggestive of vertebral osteomyelitis or epidural abscess, muscle pain that could be a symptom of pyomyositis)?  Keep in mind that for deep seeded infections MRI imaging with contrast is preferred rather than other often insensitive tests such as plain x-rays, especially early in a patient's presentation.  Obviously concern is for poentilal of operative site to be infected and there to be CSF seeding  One could consider MRI brain in addition to imaging of his spine    Change antibiotic therapy to  CEFTRIAXONE2 grams IV q 12 hours to opitmize CSF penetration. Nafcillin would be acceptable as well but the continuous infusion will not be "user friendly" for the patient and there is risk for AIN [X]  [ ]  Beta-lactam antibiotics are preferred for MSSA due to higher cure rates.  If on Vancomycin, goal trough should be 15 - 20 mcg/mL  Estimated duration of IV antibiotic therapy: 6 WEEKS to 8 weeks likely  In this patient with possibility of deep infection near spinal hardware I would err on side of treating for 6-8 weeks and because of this not get TEE    [ ]  [ ]  Consult case management for probably prolonged outpatient IV antibiotic therapy       Dr Megan Salon is taking over the servcie tomorow   LOS: 3 days   Alcide Evener 02/13/2015, 7:10 PM

## 2015-02-14 ENCOUNTER — Encounter (HOSPITAL_COMMUNITY)
Admission: EM | Disposition: A | Discharge status: Home Health | Payer: Self-pay | Source: Home / Self Care | Attending: Neurosurgery

## 2015-02-14 ENCOUNTER — Encounter (HOSPITAL_COMMUNITY): Payer: Self-pay | Admitting: Certified Registered Nurse Anesthetist

## 2015-02-14 ENCOUNTER — Inpatient Hospital Stay (HOSPITAL_COMMUNITY): Payer: BLUE CROSS/BLUE SHIELD | Admitting: Anesthesiology

## 2015-02-14 ENCOUNTER — Inpatient Hospital Stay (HOSPITAL_COMMUNITY): Payer: BLUE CROSS/BLUE SHIELD

## 2015-02-14 HISTORY — PX: LUMBAR WOUND DEBRIDEMENT: SHX1988

## 2015-02-14 LAB — HCV AB VERIFICATION: HCV ANTIBODY VERIFICATION: NONREACTIVE

## 2015-02-14 LAB — GRAM STAIN

## 2015-02-14 LAB — HIV ANTIBODY (ROUTINE TESTING W REFLEX): HIV SCREEN 4TH GENERATION: NONREACTIVE

## 2015-02-14 LAB — COMMENT4 - HEP PANEL

## 2015-02-14 LAB — HEMOGLOBIN A1C
HEMOGLOBIN A1C: 6 % — AB (ref 4.8–5.6)
MEAN PLASMA GLUCOSE: 126 mg/dL

## 2015-02-14 LAB — HEPATITIS C ANTIBODY (REFLEX): HCV Ab: 3.4 s/co ratio — ABNORMAL HIGH (ref 0.0–0.9)

## 2015-02-14 SURGERY — LUMBAR WOUND DEBRIDEMENT
Anesthesia: General | Site: Back

## 2015-02-14 MED ORDER — PROPOFOL 10 MG/ML IV BOLUS
INTRAVENOUS | Status: DC | PRN
  Administered 2015-02-14: 170 mg via INTRAVENOUS

## 2015-02-14 MED ORDER — THROMBIN 5000 UNITS EX SOLR
CUTANEOUS | Status: DC | PRN
  Administered 2015-02-14 (×2): 5000 [IU] via TOPICAL

## 2015-02-14 MED ORDER — FENTANYL CITRATE (PF) 250 MCG/5ML IJ SOLN
INTRAMUSCULAR | Status: AC
  Filled 2015-02-14: qty 5

## 2015-02-14 MED ORDER — NEOSTIGMINE METHYLSULFATE 10 MG/10ML IV SOLN
INTRAVENOUS | Status: DC | PRN
  Administered 2015-02-14: 3 mg via INTRAVENOUS

## 2015-02-14 MED ORDER — SODIUM CHLORIDE 0.9 % IJ SOLN
INTRAMUSCULAR | Status: AC
  Filled 2015-02-14: qty 10

## 2015-02-14 MED ORDER — SUFENTANIL CITRATE 50 MCG/ML IV SOLN
INTRAVENOUS | Status: AC
  Filled 2015-02-14: qty 1

## 2015-02-14 MED ORDER — CEFAZOLIN SODIUM-DEXTROSE 2-3 GM-% IV SOLR
2.0000 g | Freq: Three times a day (TID) | INTRAVENOUS | Status: DC
  Administered 2015-02-14 – 2015-02-15 (×4): 2 g via INTRAVENOUS
  Filled 2015-02-14 (×5): qty 50

## 2015-02-14 MED ORDER — ONDANSETRON HCL 4 MG/2ML IJ SOLN
INTRAMUSCULAR | Status: AC
  Filled 2015-02-14: qty 2

## 2015-02-14 MED ORDER — ARTIFICIAL TEARS OP OINT
TOPICAL_OINTMENT | OPHTHALMIC | Status: AC
  Filled 2015-02-14: qty 3.5

## 2015-02-14 MED ORDER — SUFENTANIL CITRATE 50 MCG/ML IV SOLN
INTRAVENOUS | Status: DC | PRN
  Administered 2015-02-14 (×2): 10 ug via INTRAVENOUS

## 2015-02-14 MED ORDER — NEOSTIGMINE METHYLSULFATE 10 MG/10ML IV SOLN
INTRAVENOUS | Status: AC
  Filled 2015-02-14: qty 1

## 2015-02-14 MED ORDER — LIDOCAINE HCL (CARDIAC) 20 MG/ML IV SOLN
INTRAVENOUS | Status: AC
  Filled 2015-02-14: qty 10

## 2015-02-14 MED ORDER — HYDROMORPHONE HCL 1 MG/ML IJ SOLN
INTRAMUSCULAR | Status: AC
  Filled 2015-02-14: qty 1

## 2015-02-14 MED ORDER — ROCURONIUM BROMIDE 50 MG/5ML IV SOLN
INTRAVENOUS | Status: AC
  Filled 2015-02-14: qty 1

## 2015-02-14 MED ORDER — 0.9 % SODIUM CHLORIDE (POUR BTL) OPTIME
TOPICAL | Status: DC | PRN
  Administered 2015-02-14: 1000 mL

## 2015-02-14 MED ORDER — ONDANSETRON HCL 4 MG/2ML IJ SOLN
INTRAMUSCULAR | Status: DC | PRN
  Administered 2015-02-14: 4 mg via INTRAVENOUS

## 2015-02-14 MED ORDER — LACTATED RINGERS IV SOLN
INTRAVENOUS | Status: DC | PRN
  Administered 2015-02-14 (×2): via INTRAVENOUS

## 2015-02-14 MED ORDER — GLYCOPYRROLATE 0.2 MG/ML IJ SOLN
INTRAMUSCULAR | Status: DC | PRN
  Administered 2015-02-14: 0.4 mg via INTRAVENOUS

## 2015-02-14 MED ORDER — HYDROMORPHONE HCL 1 MG/ML IJ SOLN
0.2500 mg | INTRAMUSCULAR | Status: DC | PRN
  Administered 2015-02-14 (×2): 0.5 mg via INTRAVENOUS

## 2015-02-14 MED ORDER — ALBUTEROL SULFATE HFA 108 (90 BASE) MCG/ACT IN AERS
INHALATION_SPRAY | RESPIRATORY_TRACT | Status: AC
  Filled 2015-02-14: qty 6.7

## 2015-02-14 MED ORDER — HEMOSTATIC AGENTS (NO CHARGE) OPTIME
TOPICAL | Status: DC | PRN
  Administered 2015-02-14: 1 via TOPICAL

## 2015-02-14 MED ORDER — LIDOCAINE HCL (CARDIAC) 20 MG/ML IV SOLN
INTRAVENOUS | Status: DC | PRN
  Administered 2015-02-14: 100 mg via INTRAVENOUS

## 2015-02-14 MED ORDER — FENTANYL CITRATE (PF) 250 MCG/5ML IJ SOLN
INTRAMUSCULAR | Status: DC | PRN
  Administered 2015-02-14: 50 ug via INTRAVENOUS

## 2015-02-14 MED ORDER — PROMETHAZINE HCL 25 MG/ML IJ SOLN: 6.2500 mg | INTRAMUSCULAR | Status: DC | PRN

## 2015-02-14 MED ORDER — GLYCOPYRROLATE 0.2 MG/ML IJ SOLN
INTRAMUSCULAR | Status: AC
  Filled 2015-02-14: qty 2

## 2015-02-14 MED ORDER — ROCURONIUM BROMIDE 100 MG/10ML IV SOLN
INTRAVENOUS | Status: DC | PRN
  Administered 2015-02-14: 40 mg via INTRAVENOUS

## 2015-02-14 SURGICAL SUPPLY — 66 items
BENZOIN TINCTURE PRP APPL 2/3 (GAUZE/BANDAGES/DRESSINGS) IMPLANT
BLADE CLIPPER SURG (BLADE) IMPLANT
CANISTER SUCT 3000ML PPV (MISCELLANEOUS) ×3 IMPLANT
CLOSURE WOUND 1/2 X4 (GAUZE/BANDAGES/DRESSINGS)
CONT SPEC STER OR (MISCELLANEOUS) ×6 IMPLANT
DERMABOND ADVANCED (GAUZE/BANDAGES/DRESSINGS) ×2
DERMABOND ADVANCED .7 DNX12 (GAUZE/BANDAGES/DRESSINGS) ×1 IMPLANT
DRAPE LAPAROTOMY 100X72X124 (DRAPES) ×3 IMPLANT
DRAPE POUCH INSTRU U-SHP 10X18 (DRAPES) ×3 IMPLANT
DRSG OPSITE POSTOP 4X6 (GAUZE/BANDAGES/DRESSINGS) ×3 IMPLANT
DRSG OPSITE POSTOP 4X8 (GAUZE/BANDAGES/DRESSINGS) ×3 IMPLANT
DRSG TELFA 3X8 NADH (GAUZE/BANDAGES/DRESSINGS) ×3 IMPLANT
DURAPREP 26ML APPLICATOR (WOUND CARE) IMPLANT
ELECT REM PT RETURN 9FT ADLT (ELECTROSURGICAL) ×3
ELECTRODE REM PT RTRN 9FT ADLT (ELECTROSURGICAL) ×1 IMPLANT
GAUZE SPONGE 4X4 12PLY STRL (GAUZE/BANDAGES/DRESSINGS) ×3 IMPLANT
GAUZE SPONGE 4X4 16PLY XRAY LF (GAUZE/BANDAGES/DRESSINGS) IMPLANT
GLOVE BIO SURGEON STRL SZ 6.5 (GLOVE) ×2 IMPLANT
GLOVE BIO SURGEON STRL SZ7 (GLOVE) ×9 IMPLANT
GLOVE BIO SURGEON STRL SZ7.5 (GLOVE) IMPLANT
GLOVE BIO SURGEON STRL SZ8 (GLOVE) IMPLANT
GLOVE BIO SURGEON STRL SZ8.5 (GLOVE) IMPLANT
GLOVE BIO SURGEONS STRL SZ 6.5 (GLOVE) ×1
GLOVE BIOGEL M 8.0 STRL (GLOVE) ×3 IMPLANT
GLOVE ECLIPSE 6.5 STRL STRAW (GLOVE) IMPLANT
GLOVE ECLIPSE 7.0 STRL STRAW (GLOVE) IMPLANT
GLOVE ECLIPSE 7.5 STRL STRAW (GLOVE) IMPLANT
GLOVE ECLIPSE 8.0 STRL XLNG CF (GLOVE) IMPLANT
GLOVE ECLIPSE 8.5 STRL (GLOVE) IMPLANT
GLOVE EXAM NITRILE LRG STRL (GLOVE) IMPLANT
GLOVE EXAM NITRILE MD LF STRL (GLOVE) IMPLANT
GLOVE EXAM NITRILE XL STR (GLOVE) IMPLANT
GLOVE EXAM NITRILE XS STR PU (GLOVE) IMPLANT
GLOVE INDICATOR 6.5 STRL GRN (GLOVE) IMPLANT
GLOVE INDICATOR 7.0 STRL GRN (GLOVE) IMPLANT
GLOVE INDICATOR 7.5 STRL GRN (GLOVE) IMPLANT
GLOVE INDICATOR 8.0 STRL GRN (GLOVE) IMPLANT
GLOVE INDICATOR 8.5 STRL (GLOVE) IMPLANT
GLOVE OPTIFIT SS 8.0 STRL (GLOVE) IMPLANT
GLOVE SURG SS PI 6.5 STRL IVOR (GLOVE) IMPLANT
GOWN STRL REUS W/ TWL LRG LVL3 (GOWN DISPOSABLE) ×3 IMPLANT
GOWN STRL REUS W/ TWL XL LVL3 (GOWN DISPOSABLE) IMPLANT
GOWN STRL REUS W/TWL 2XL LVL3 (GOWN DISPOSABLE) IMPLANT
GOWN STRL REUS W/TWL LRG LVL3 (GOWN DISPOSABLE) ×6
GOWN STRL REUS W/TWL XL LVL3 (GOWN DISPOSABLE)
GRAFT DURAGEN MATRIX 1WX1L (Tissue) ×3 IMPLANT
KIT BASIN OR (CUSTOM PROCEDURE TRAY) ×3 IMPLANT
KIT ROOM TURNOVER OR (KITS) ×3 IMPLANT
NS IRRIG 1000ML POUR BTL (IV SOLUTION) ×3 IMPLANT
PACK LAMINECTOMY NEURO (CUSTOM PROCEDURE TRAY) ×3 IMPLANT
PAD ARMBOARD 7.5X6 YLW CONV (MISCELLANEOUS) ×9 IMPLANT
SPONGE SURGIFOAM ABS GEL SZ50 (HEMOSTASIS) ×3 IMPLANT
STRIP CLOSURE SKIN 1/2X4 (GAUZE/BANDAGES/DRESSINGS) IMPLANT
SUT ETHILON 2 0 FS 18 (SUTURE) ×3 IMPLANT
SUT PROLENE 6 0 BV (SUTURE) ×9 IMPLANT
SUT VIC AB 0 CT1 18XCR BRD8 (SUTURE) ×1 IMPLANT
SUT VIC AB 0 CT1 8-18 (SUTURE) ×2
SUT VIC AB 2-0 CP2 18 (SUTURE) ×3 IMPLANT
SUT VIC AB 3-0 SH 8-18 (SUTURE) ×3 IMPLANT
SWAB COLLECTION DEVICE MRSA (MISCELLANEOUS) ×3 IMPLANT
SWAB CULTURE LIQ STUART DBL (MISCELLANEOUS) ×3 IMPLANT
SYR 20ML ECCENTRIC (SYRINGE) ×3 IMPLANT
TOWEL OR 17X24 6PK STRL BLUE (TOWEL DISPOSABLE) ×3 IMPLANT
TOWEL OR 17X26 10 PK STRL BLUE (TOWEL DISPOSABLE) ×3 IMPLANT
TUBE ANAEROBIC SPECIMEN COL (MISCELLANEOUS) ×3 IMPLANT
WATER STERILE IRR 1000ML POUR (IV SOLUTION) ×3 IMPLANT

## 2015-02-14 NOTE — Assessment & Plan Note (Signed)
James Hays has a postoperative epidural abscess complicated by transient staph aureus bacteremia. He does not have any clinical evidence of meningitis. I will change ceftriaxone to cefazolin.

## 2015-02-14 NOTE — Progress Notes (Signed)
CRITICAL VALUE ALERT  Critical value received:  In wound and tissue samples: Few WBC poly and Mono with Rare gram positive cocci in pairs  Date of notification:  02/14/2015  Time of notification:  2230  Critical value read back:Yes.    Nurse who received alert:  Eliane Decree, RN   MD notified (1st page):    Time of first page:    MD notified (2nd page):  Time of second page:  Responding MD:    Time MD responded:   Primary Nurse, Cedric Fishman, made aware.

## 2015-02-14 NOTE — Anesthesia Preprocedure Evaluation (Addendum)
Anesthesia Evaluation  Patient identified by MRN, date of birth, ID band Patient awake    Reviewed: Allergy & Precautions, NPO status , Patient's Chart, lab work & pertinent test results  Airway Mallampati: I  TM Distance: >3 FB Neck ROM: Full    Dental  (+) Edentulous Upper, Edentulous Lower, Dental Advisory Given   Pulmonary Current Smoker,    Pulmonary exam normal       Cardiovascular Exercise Tolerance: Good Normal cardiovascular exam    Neuro/Psych negative psych ROS   GI/Hepatic negative GI ROS, Neg liver ROS,   Endo/Other  negative endocrine ROS  Renal/GU negative Renal ROS  negative genitourinary   Musculoskeletal   Abdominal   Peds negative pediatric ROS (+)  Hematology negative hematology ROS (+)   Anesthesia Other Findings   Reproductive/Obstetrics                          Anesthesia Physical Anesthesia Plan  ASA: II  Anesthesia Plan: General   Post-op Pain Management:    Induction: Intravenous  Airway Management Planned: Oral ETT  Additional Equipment:   Intra-op Plan:   Post-operative Plan: Extubation in OR  Informed Consent: I have reviewed the patients History and Physical, chart, labs and discussed the procedure including the risks, benefits and alternatives for the proposed anesthesia with the patient or authorized representative who has indicated his/her understanding and acceptance.   Dental advisory given  Plan Discussed with: CRNA, Anesthesiologist and Surgeon  Anesthesia Plan Comments:        Anesthesia Quick Evaluation

## 2015-02-14 NOTE — Anesthesia Procedure Notes (Signed)
Procedure Name: Intubation Date/Time: 02/14/2015 7:23 PM Performed by: Claris Che Pre-anesthesia Checklist: Patient identified, Emergency Drugs available, Suction available, Patient being monitored and Timeout performed Patient Re-evaluated:Patient Re-evaluated prior to inductionOxygen Delivery Method: Circle system utilized Preoxygenation: Pre-oxygenation with 100% oxygen Intubation Type: IV induction Ventilation: Mask ventilation without difficulty Laryngoscope Size: Mac and 3 Grade View: Grade II Tube type: Oral Tube size: 7.5 mm Number of attempts: 1 Airway Equipment and Method: Stylet Placement Confirmation: ETT inserted through vocal cords under direct vision,  positive ETCO2 and breath sounds checked- equal and bilateral Secured at: 23 cm Tube secured with: Tape Dental Injury: Teeth and Oropharynx as per pre-operative assessment

## 2015-02-14 NOTE — Anesthesia Postprocedure Evaluation (Signed)
Anesthesia Post Note  Patient: James Hays  Procedure(s) Performed: Procedure(s) (LRB): EXPLORATION LUMBAR WOUND, REPAIR OF CSF LEAK (N/A)  Anesthesia type: general  Patient location: PACU  Post pain: Pain level controlled  Post assessment: Patient's Cardiovascular Status Stable  Last Vitals:  Filed Vitals:   02/14/15 2215  BP: 112/73  Pulse: 95  Temp:   Resp: 7    Post vital signs: Reviewed and stable  Level of consciousness: sedated  Complications: No apparent anesthesia complications

## 2015-02-14 NOTE — Progress Notes (Signed)
Patient ID: James Hays, male   DOB: 07-26-1950, 64 y.o.   MRN: [id number] No fever, minimal headache, no weakness. Dressing wet. Mri seen with dr Jola Baptist. Epidural infection around l3-4. For exploration this pm. Patient aware. Waiting for wife

## 2015-02-14 NOTE — Transfer of Care (Signed)
Immediate Anesthesia Transfer of Care Note  Patient: James Hays  Procedure(s) Performed: Procedure(s): EXPLORATION LUMBAR WOUND, REPAIR OF CSF LEAK (N/A)  Patient Location: PACU  Anesthesia Type:General  Level of Consciousness: sedated, patient cooperative and responds to stimulation  Airway & Oxygen Therapy: Patient Spontanous Breathing and Patient connected to nasal cannula oxygen  Post-op Assessment: Report given to RN, Post -op Vital signs reviewed and stable and Patient moving all extremities X 4  Post vital signs: Reviewed and stable  Last Vitals:  Filed Vitals:   02/14/15 1900  BP: 113/63  Pulse: 91  Temp:   Resp: 14    Complications: No apparent anesthesia complications

## 2015-02-14 NOTE — Progress Notes (Signed)
Patient ID: James Hays, male   DOB: [birth year], 64 y.o.   MRN: [id number]         [facility] for Infectious Disease    Date of Admission:  02/10/2015   Total days of antibiotics 4          Active Problems:   Post-operative complication   Postoperative CSF leak   Surgery, other elective   S/P diskectomy   Staphylococcus aureus bacteremia   Other postoperative complication involving nervous system   . cefTRIAXone (ROCEPHIN)  IV  2 g Intravenous Q12H  . cholecalciferol  1,000 Units Oral Daily  . heparin subcutaneous  5,000 Units Subcutaneous 3 times per day  . sodium chloride  3 mL Intravenous Q12H    Subjective: " I just had severe muscle spasms. I thought I was going to die."  Review of Systems: Pertinent items are noted in HPI.  Past Medical History  Diagnosis Date  . Hyperlipidemia     Social History  Substance Use Topics  . Smoking status: Current Every Day Smoker -- 1.00 packs/day    Types: Cigarettes  . Smokeless tobacco: Never Used  . Alcohol Use: No    Family History  Problem Relation Age of Onset  . Hypertension Mother   . Diabetes Mother   . Heart disease Father 99  . Hyperlipidemia Father   . Diabetes Brother    No Known Allergies  OBJECTIVE: Filed Vitals:   02/14/15 1144 02/14/15 1200 02/14/15 1400 02/14/15 1500  BP:  122/69 136/80 121/99  Pulse:  79 76 86  Temp: 97.9 F (36.6 C)     TempSrc: Oral     Resp:  12 11 17   Height:      Weight:      SpO2:  90% 98% 93%   Body mass index is 25.95 kg/(m^2).  General: He is resting quietly in bed. He is grimacing with pain Skin: No rash  Lungs: Clear Cor: Regular S1 and S2 with no murmurs  Lab Results Lab Results  Component Value Date   WBC 8.2 02/13/2015   HGB 13.3 02/13/2015   HCT 39.6 02/13/2015   MCV 94.3 02/13/2015   PLT 216 02/13/2015    Lab Results  Component Value Date   CREATININE 1.05 02/10/2015   BUN 11 02/10/2015   NA 135 02/10/2015   K 4.0 02/10/2015   CL 102  02/10/2015   CO2 22 02/10/2015   No results found for: ALT, AST, GGT, ALKPHOS, BILITOT   Microbiology: Recent Results (from the past 240 hour(s))  Blood culture (routine x 2)     Status: None   Collection Time: 02/10/15  7:37 PM  Result Value Ref Range Status   Specimen Description BLOOD LEFT ARM  Final   Special Requests BOTTLES DRAWN AEROBIC AND ANAEROBIC 6CC  Final   Culture  Setup Time   Final    GRAM POSITIVE COCCI IN CLUSTERS ANAEROBIC BOTTLE ONLY CRITICAL RESULT CALLED TO, READ BACK BY AND VERIFIED WITH: J.CARMICHAEL,RN 02/11/15 @2135  BY V.WILKINS CONFIRMED BY T.[place] Gram Stain Report Called to,Read Back By and Verified With: [place] K. AT 3762 ON [id number] BY THOMPSON S.    Culture   Final    STAPHYLOCOCCUS AUREUS Performed at [facility]    Report Status 02/13/2015 FINAL  Final   Organism ID, Bacteria STAPHYLOCOCCUS AUREUS  Final      Susceptibility   Staphylococcus aureus - MIC*    CIPROFLOXACIN <=0.5 SENSITIVE Sensitive  ERYTHROMYCIN <=0.25 SENSITIVE Sensitive     GENTAMICIN <=0.5 SENSITIVE Sensitive     OXACILLIN <=0.25 SENSITIVE Sensitive     TETRACYCLINE <=1 SENSITIVE Sensitive     VANCOMYCIN 1 SENSITIVE Sensitive     TRIMETH/SULFA <=10 SENSITIVE Sensitive     CLINDAMYCIN <=0.25 SENSITIVE Sensitive     RIFAMPIN <=0.5 SENSITIVE Sensitive     Inducible Clindamycin NEGATIVE Sensitive     * STAPHYLOCOCCUS AUREUS  Blood culture (routine x 2)     Status: None (Preliminary result)   Collection Time: 02/10/15  7:40 PM  Result Value Ref Range Status   Specimen Description BLOOD LEFT HAND  Final   Special Requests BOTTLES DRAWN AEROBIC AND ANAEROBIC 6CC  Final   Culture NO GROWTH 4 DAYS  Final   Report Status PENDING  Incomplete  MRSA PCR Screening     Status: None   Collection Time: 02/11/15  9:47 PM  Result Value Ref Range Status   MRSA by PCR NEGATIVE NEGATIVE Final    Comment:        The GeneXpert MRSA Assay (FDA approved for NASAL  specimens only), is one component of a comprehensive MRSA colonization surveillance program. It is not intended to diagnose MRSA infection nor to guide or monitor treatment for MRSA infections.   Culture, blood (routine x 2)     Status: None (Preliminary result)   Collection Time: 02/12/15 12:50 AM  Result Value Ref Range Status   Specimen Description BLOOD LEFT ANTECUBITAL  Final   Special Requests BOTTLES DRAWN AEROBIC AND ANAEROBIC 10CC  Final   Culture NO GROWTH 2 DAYS  Final   Report Status PENDING  Incomplete  Culture, blood (routine x 2)     Status: None (Preliminary result)   Collection Time: 02/12/15 12:55 AM  Result Value Ref Range Status   Specimen Description BLOOD LEFT HAND  Final   Special Requests BOTTLES DRAWN AEROBIC ONLY 5CC  Final   Culture NO GROWTH 2 DAYS  Final   Report Status PENDING  Incomplete  Blood Cultures x 2 sites     Status: None (Preliminary result)   Collection Time: 02/13/15 10:23 AM  Result Value Ref Range Status   Specimen Description BLOOD LEFT ANTECUBITAL  Final   Special Requests BOTTLES DRAWN AEROBIC AND ANAEROBIC  10CC  Final   Culture NO GROWTH 1 DAY  Final   Report Status PENDING  Incomplete  Blood Cultures x 2 sites     Status: None (Preliminary result)   Collection Time: 02/13/15 10:31 AM  Result Value Ref Range Status   Specimen Description BLOOD LEFT HAND  Final   Special Requests BOTTLES DRAWN AEROBIC ONLY 2CC  Final   Culture NO GROWTH 1 DAY  Final   Report Status PENDING  Incomplete   MRI LUMBAR SPINE WITHOUT AND WITH CONTRAST  COMPARISON: MRI of the lumbar spine January 25, 2015  IMPRESSION: 4.3 x 0.8 x 0.8 cm ventral epidural fluid collection from mid L3 to L4-5 disc space, compatible with epidural abscess, less likely hematoma. Fluid collection communicates within the dorsal L3-4 disc concerning for a superimposed discitis. Status post RIGHT L3 hemilaminectomy. Subcentimeter fluid collection within the surgical bed  could represent seroma or even small pseudomeningocele, less likely abscess.  New edema within the L4-5 disc, this favors reactive changes, less likely discitis.  No canal stenosis. Moderate RIGHT L3-4 and L4-5 neural foraminal narrowing, mild on the LEFT at these levels.   Electronically Signed  By: Thana Farr.D.  On: 02/13/2015 23:45  Problem List Items Addressed This Visit      Unprioritized   Other postoperative complication involving nervous system - Primary    Mr. Kakos has a postoperative epidural abscess complicated by transient staph aureus bacteremia. He does not have any clinical evidence of meningitis. I will change ceftriaxone to cefazolin.      Surgery, other elective   Relevant Orders   DG Lumbar Spine 1 View (Completed)    Other Visit Diagnoses    Bacteremia        Relevant Orders    MR Lumbar Spine W Wo Contrast (Completed)        Michel Bickers, MD [facility] [phone] pager   [phone] cell 02/14/2015, 4:08 PM

## 2015-02-15 ENCOUNTER — Encounter (HOSPITAL_COMMUNITY): Payer: Self-pay | Admitting: Neurosurgery

## 2015-02-15 DIAGNOSIS — G061 Intraspinal abscess and granuloma: Secondary | ICD-10-CM | POA: Diagnosis present

## 2015-02-15 LAB — CULTURE, BLOOD (ROUTINE X 2): Culture: NO GROWTH

## 2015-02-15 MED ORDER — CEFAZOLIN SODIUM-DEXTROSE 2-3 GM-% IV SOLR
2.0000 g | Freq: Three times a day (TID) | INTRAVENOUS | Status: DC
  Administered 2015-02-15 – 2015-02-24 (×27): 2 g via INTRAVENOUS
  Filled 2015-02-15 (×32): qty 50

## 2015-02-15 MED ORDER — DEXTROSE 5 % IV SOLN
2.0000 g | INTRAVENOUS | Status: DC
  Filled 2015-02-15: qty 2

## 2015-02-15 NOTE — Progress Notes (Signed)
Patient ID: James Hays, male   DOB: 01-10-1951, 64 y.o.   MRN: [id number] Stable. No headache. Lumbar drain working well. Continue bed rest with hob down. ID helping with his treatment.

## 2015-02-15 NOTE — Op Note (Signed)
NAMEMarland Hays  SULLY, DYMENT NO.:  [id number]  MEDICAL RECORD NO.:  [id number]  LOCATION:  3M12C                        FACILITY:  [facility]  PHYSICIAN:  [name], M.D.   DATE OF BIRTH:  04-05-1951  DATE OF PROCEDURE:  02/14/2015 DATE OF DISCHARGE:                              OPERATIVE REPORT   PREOPERATIVE DIAGNOSES:  Lumbar epidural abscess.  Cerebrospinal fluid leak.  Status post diskectomy 10 days ago.  POSTOPERATIVE DIAGNOSES:  Lumbar epidural abscess.  Cerebrospinal fluid leak.  Status post diskectomy 10 days ago.  PROCEDURE:  Exploration of the lumbar wound.  Left-side hemilaminectomy. Repair of dural tear.  I and D of epidural abscess posterolateral to the right.  Microscope.  SURGEON:  [name], M.D.  CLINICAL HISTORY:  The patient had surgery 10 days ago because of the large fragment of herniated disk given weakness of the right quadriceps. The patient did well, but over the weekend, he went to [facility] because of headache and fever.  Blood cultures were positive for Staph.  Nevertheless, he was transferred to [facility] where he had intradural catheter inserted by Dr. Kathyrn Sheriff.  The patient was kept in the ICU.  He had been seen by the Infectious Disease.  We did an MRI, which showed the possibility of epidural abscess with evidence of fluid in the dural space.  Surgery was advised.  I talked to his wife as well as his son who lives in [place].  They knew the risks and benefits with the surgery.  DESCRIPTION OF PROCEDURE:  The patient was taken to the OR, and after intubation, he was positioned in a prone manner.  The back was cleaned with ChloraPrep.  We closed the intradural catheter.  Drapes were applied.  Midline incision was made and immediately right underneath the fascia, we saw a mix of CSF plus some yellowish fluid.  This specimen was taken and sent to the lab for Gram as well as for culture.  We brought in  immediately the microscope and we explored the area of the right side where he had the surgery.  We found that there was some CSF coming mostly from the midline towards the left.  Because of that, I went ahead and removed the spinous process of L3-L4 as well as the lamina.  Indeed, there was an opening in the midline.  The tissue was quite friable.  Dissection was carried out and 3 different stitches of Nurolon were used to close the dura mater.  NeuroGen was applied to the area.  Prior to that, I explored the anterior right side of the canal and there was some soft tissue mostly infected.  Specimen was taken and sent to the lab.  The area was cleaned with large amount of saline solution.  From then on, the area was irrigated.  Valsalva maneuver was negative.  We know by x-ray today during surgery that the tip of the catheter was at the level of thoracic vein.  From then on, the area was irrigated and closed with different layers of Vicryl and nylon.  After the procedure, I called both members of the family to let them know that he will  be in the intensive care unit.  He will be with the head of the bed down to 10 degrees with a drain open. He will continue antibiotics as per Infectious Disease.          ______________________________ [name], M.D.     EB/MEDQ  D:  02/14/2015  T:  02/15/2015  Job:  [id number]

## 2015-02-15 NOTE — Progress Notes (Signed)
Patient ID: James Hays, male   DOB: 1950-10-20, 64 y.o.   MRN: [id number]         [facility] for Infectious Disease    Date of Admission:  02/10/2015   Total days of antibiotics 5          Active Problems:   Post-operative complication   Postoperative CSF leak   Surgery, other elective   S/P diskectomy   Staphylococcus aureus bacteremia   Other postoperative complication involving nervous system   .  ceFAZolin (ANCEF) IV  2 g Intravenous Q8H  . cholecalciferol  1,000 Units Oral Daily  . heparin subcutaneous  5,000 Units Subcutaneous 3 times per day  . sodium chloride  3 mL Intravenous Q12H    Subjective: He states that he is very thirsty. His back pain has been a little bit better today. He had his epidural abscess drained last night.  Review of Systems: Pertinent items are noted in HPI.  Past Medical History  Diagnosis Date  . Hyperlipidemia     Social History  Substance Use Topics  . Smoking status: Current Every Day Smoker -- 1.00 packs/day    Types: Cigarettes  . Smokeless tobacco: Never Used  . Alcohol Use: No    Family History  Problem Relation Age of Onset  . Hypertension Mother   . Diabetes Mother   . Heart disease Father 26  . Hyperlipidemia Father   . Diabetes Brother    No Known Allergies  OBJECTIVE: Filed Vitals:   02/15/15 1500 02/15/15 1600 02/15/15 1642 02/15/15 1700  BP: 97/58 94/57  98/51  Pulse: 74 74  54  Temp:   98.9 F (37.2 C)   TempSrc:   Oral   Resp: 12 12  11   Height:      Weight:      SpO2: 94% 93%  96%   Body mass index is 25.95 kg/(m^2).  General: He is resting quietly in bed.  Skin: No rash  Lungs: Clear Cor: Regular S1 and S2 with no murmurs  Lab Results Lab Results  Component Value Date   WBC 8.2 02/13/2015   HGB 13.3 02/13/2015   HCT 39.6 02/13/2015   MCV 94.3 02/13/2015   PLT 216 02/13/2015    Lab Results  Component Value Date   CREATININE 1.05 02/10/2015   BUN 11 02/10/2015   NA 135  02/10/2015   K 4.0 02/10/2015   CL 102 02/10/2015   CO2 22 02/10/2015   No results found for: ALT, AST, GGT, ALKPHOS, BILITOT   Microbiology: Recent Results (from the past 240 hour(s))  Blood culture (routine x 2)     Status: None   Collection Time: 02/10/15  7:37 PM  Result Value Ref Range Status   Specimen Description BLOOD LEFT ARM  Final   Special Requests BOTTLES DRAWN AEROBIC AND ANAEROBIC 6CC  Final   Culture  Setup Time   Final    GRAM POSITIVE COCCI IN CLUSTERS ANAEROBIC BOTTLE ONLY CRITICAL RESULT CALLED TO, READ BACK BY AND VERIFIED WITH: J.CARMICHAEL,RN 02/11/15 @2135  BY V.WILKINS CONFIRMED BY T.[place] Gram Stain Report Called to,Read Back By and Verified With: Beverly K. AT [id number] ON [id number] BY THOMPSON S.    Culture   Final    STAPHYLOCOCCUS AUREUS Performed at [facility]    Report Status 02/13/2015 FINAL  Final   Organism ID, Bacteria STAPHYLOCOCCUS AUREUS  Final      Susceptibility   Staphylococcus aureus - MIC*    CIPROFLOXACIN <=  0.5 SENSITIVE Sensitive     ERYTHROMYCIN <=0.25 SENSITIVE Sensitive     GENTAMICIN <=0.5 SENSITIVE Sensitive     OXACILLIN <=0.25 SENSITIVE Sensitive     TETRACYCLINE <=1 SENSITIVE Sensitive     VANCOMYCIN 1 SENSITIVE Sensitive     TRIMETH/SULFA <=10 SENSITIVE Sensitive     CLINDAMYCIN <=0.25 SENSITIVE Sensitive     RIFAMPIN <=0.5 SENSITIVE Sensitive     Inducible Clindamycin NEGATIVE Sensitive     * STAPHYLOCOCCUS AUREUS  Blood culture (routine x 2)     Status: None   Collection Time: 02/10/15  7:40 PM  Result Value Ref Range Status   Specimen Description BLOOD LEFT HAND  Final   Special Requests BOTTLES DRAWN AEROBIC AND ANAEROBIC 6CC  Final   Culture NO GROWTH 5 DAYS  Final   Report Status 02/15/2015 FINAL  Final  MRSA PCR Screening     Status: None   Collection Time: 02/11/15  9:47 PM  Result Value Ref Range Status   MRSA by PCR NEGATIVE NEGATIVE Final    Comment:        The GeneXpert MRSA Assay (FDA approved  for NASAL specimens only), is one component of a comprehensive MRSA colonization surveillance program. It is not intended to diagnose MRSA infection nor to guide or monitor treatment for MRSA infections.   Culture, blood (routine x 2)     Status: None (Preliminary result)   Collection Time: 02/12/15 12:50 AM  Result Value Ref Range Status   Specimen Description BLOOD LEFT ANTECUBITAL  Final   Special Requests BOTTLES DRAWN AEROBIC AND ANAEROBIC 10CC  Final   Culture NO GROWTH 3 DAYS  Final   Report Status PENDING  Incomplete  Culture, blood (routine x 2)     Status: None (Preliminary result)   Collection Time: 02/12/15 12:55 AM  Result Value Ref Range Status   Specimen Description BLOOD LEFT HAND  Final   Special Requests BOTTLES DRAWN AEROBIC ONLY 5CC  Final   Culture NO GROWTH 3 DAYS  Final   Report Status PENDING  Incomplete  Blood Cultures x 2 sites     Status: None (Preliminary result)   Collection Time: 02/13/15 10:23 AM  Result Value Ref Range Status   Specimen Description BLOOD LEFT ANTECUBITAL  Final   Special Requests BOTTLES DRAWN AEROBIC AND ANAEROBIC  10CC  Final   Culture NO GROWTH 2 DAYS  Final   Report Status PENDING  Incomplete  Blood Cultures x 2 sites     Status: None (Preliminary result)   Collection Time: 02/13/15 10:31 AM  Result Value Ref Range Status   Specimen Description BLOOD LEFT HAND  Final   Special Requests BOTTLES DRAWN AEROBIC ONLY 2CC  Final   Culture NO GROWTH 2 DAYS  Final   Report Status PENDING  Incomplete  Anaerobic culture     Status: None (Preliminary result)   Collection Time: 02/14/15  8:14 PM  Result Value Ref Range Status   Specimen Description WOUND BACK  Final   Special Requests NONE  Final   Gram Stain   Final    FEW WBC PRESENT,BOTH PMN AND MONONUCLEAR NO SQUAMOUS EPITHELIAL CELLS SEEN RARE GRAM POSITIVE COCCI IN PAIRS Performed at [facility]    Culture   Final    NO ANAEROBES ISOLATED; CULTURE IN PROGRESS  FOR 5 DAYS Performed at [facility]    Report Status PENDING  Incomplete  Anaerobic culture     Status: None (Preliminary result)  Collection Time: 02/14/15  8:14 PM  Result Value Ref Range Status   Specimen Description TISSUE  Final   Special Requests BACK  Final   Gram Stain   Final    RARE WBC PRESENT, PREDOMINANTLY PMN NO ORGANISMS SEEN Performed at [facility]    Culture   Final    NO ANAEROBES ISOLATED; CULTURE IN PROGRESS FOR 5 DAYS Performed at [facility]    Report Status PENDING  Incomplete  Fungus Culture with Smear     Status: None (Preliminary result)   Collection Time: 02/14/15  8:14 PM  Result Value Ref Range Status   Specimen Description WOUND BACK  Final   Special Requests NONE  Final   Fungal Smear   Final    NO YEAST OR FUNGAL ELEMENTS SEEN Performed at [facility]    Culture   Final    CULTURE IN PROGRESS FOR FOUR WEEKS Performed at [facility]    Report Status PENDING  Incomplete  Fungus Culture with Smear     Status: None (Preliminary result)   Collection Time: 02/14/15  8:14 PM  Result Value Ref Range Status   Specimen Description TISSUE  Final   Special Requests BACK  Final   Fungal Smear   Final    NO YEAST OR FUNGAL ELEMENTS SEEN Performed at [facility]    Culture   Final    CULTURE IN PROGRESS FOR FOUR WEEKS Performed at [facility]    Report Status PENDING  Incomplete  Gram stain     Status: None   Collection Time: 02/14/15  8:14 PM  Result Value Ref Range Status   Specimen Description WOUND BACK  Final   Special Requests NONE  Final   Gram Stain   Final    FEW WBC PRESENT,BOTH PMN AND MONONUCLEAR RARE GRAM POSITIVE COCCI IN PAIRS Gram Stain Report Called to,Read Back By and Verified With: Nicholaus Bloom RN 2234 02/14/15 A BROWNING    Report Status 02/14/2015 FINAL  Final  Gram stain     Status: None   Collection Time: 02/14/15  8:14 PM  Result Value Ref Range Status    Specimen Description TISSUE BACK  Final   Special Requests NONE  Final   Gram Stain   Final    FEW WBC PRESENT,BOTH PMN AND MONONUCLEAR RARE GRAM POSITIVE COCCI IN PAIRS Gram Stain Report Called to,Read Back By and Verified With: Nicholaus Bloom RN 2234 02/14/15 A BROWNING    Report Status 02/14/2015 FINAL  Final  Wound culture     Status: None (Preliminary result)   Collection Time: 02/14/15  8:14 PM  Result Value Ref Range Status   Specimen Description WOUND BACK  Final   Special Requests NONE  Final   Gram Stain   Final    FEW WBC PRESENT,BOTH PMN AND MONONUCLEAR NO SQUAMOUS EPITHELIAL CELLS SEEN RARE GRAM POSITIVE COCCI IN PAIRS Gram Stain Report Called to,Read Back By and Verified With: Gram Stain Report Called to,Read Back By and Verified With: Nicholaus Bloom RN 2234 02/14/15 BY A BROWNING Performed at [facility] Performed at [facility]    Culture NO GROWTH Performed at [facility]   Final   Report Status PENDING  Incomplete  AFB culture with smear     Status: None (Preliminary result)   Collection Time: 02/14/15  8:14 PM  Result Value Ref Range Status   Specimen Description WOUND BACK  Final  Special Requests NONE  Final   Acid Fast Smear   Final    NO ACID FAST BACILLI SEEN Performed at [facility]    Culture   Final    CULTURE WILL BE EXAMINED FOR 6 WEEKS BEFORE ISSUING A FINAL REPORT Performed at [facility]    Report Status PENDING  Incomplete  Tissue culture     Status: None (Preliminary result)   Collection Time: 02/14/15  8:14 PM  Result Value Ref Range Status   Specimen Description TISSUE BACK  Final   Special Requests NONE  Final   Gram Stain   Final    RARE WBC PRESENT, PREDOMINANTLY PMN NO ORGANISMS SEEN Performed at [facility]    Culture NO GROWTH Performed at [facility]   Final   Report Status PENDING  Incomplete  AFB culture with smear     Status: None (Preliminary result)   Collection Time:  02/14/15  9:04 PM  Result Value Ref Range Status   Specimen Description TISSUE  Final   Special Requests BACK  Final   Acid Fast Smear   Final    NO ACID FAST BACILLI SEEN Performed at [facility]    Culture   Final    CULTURE WILL BE EXAMINED FOR 6 WEEKS BEFORE ISSUING A FINAL REPORT Performed at [facility]    Report Status PENDING  Incomplete   MRI LUMBAR SPINE WITHOUT AND WITH CONTRAST  COMPARISON: MRI of the lumbar spine January 25, 2015  IMPRESSION: 4.3 x 0.8 x 0.8 cm ventral epidural fluid collection from mid L3 to L4-5 disc space, compatible with epidural abscess, less likely hematoma. Fluid collection communicates within the dorsal L3-4 disc concerning for a superimposed discitis. Status post RIGHT L3 hemilaminectomy. Subcentimeter fluid collection within the surgical bed could represent seroma or even small pseudomeningocele, less likely abscess.  New edema within the L4-5 disc, this favors reactive changes, less likely discitis.  No canal stenosis. Moderate RIGHT L3-4 and L4-5 neural foraminal narrowing, mild on the LEFT at these levels.   Electronically Signed  By: Elon Alas M.D.  On: 02/13/2015 23:45   Assessment: He has and MSSA epidural abscess complicated by transient bacteremia following his recent surgery. I will continue cefazolin.  Plan: 1. Continue cefazolin pending final operative culture results 2. PICC placement  Michel Bickers, MD [facility] for [facility] [phone] pager   [phone] cell 02/15/2015, 5:35 PM

## 2015-02-16 DIAGNOSIS — G061 Intraspinal abscess and granuloma: Secondary | ICD-10-CM

## 2015-02-16 NOTE — Progress Notes (Signed)
Patient ID: James Hays, male   DOB: 16-Nov-1950, 64 y.o.   MRN: [id number] Stable. Lumbar drain working well

## 2015-02-16 NOTE — Progress Notes (Signed)
Patient ID: James Hays, male   DOB: Jun 01, 1951, 64 y.o.   MRN: [id number] Afebril, no headache. C/o incisional pain, no weakness. ID helping

## 2015-02-16 NOTE — Progress Notes (Signed)
Patient ID: James Hays, male   DOB: [birth year], 64 y.o.   MRN: [id number]         [facility]    Date of Admission:  02/10/2015   Total days of antibiotics 6          Principal Problem:   Abscess in epidural space of lumbar spine Active Problems:   Staphylococcus aureus bacteremia   Post-operative complication   Postoperative CSF leak   Surgery, other elective   S/P diskectomy   Other postoperative complication involving nervous system   .  ceFAZolin (ANCEF) IV  2 g Intravenous Q8H  . cholecalciferol  1,000 Units Oral Daily  . heparin subcutaneous  5,000 Units Subcutaneous 3 times per day  . sodium chloride  3 mL Intravenous Q12H    OBJECTIVE: Filed Vitals:   02/16/15 1126 02/16/15 1200 02/16/15 1300 02/16/15 1400  BP:  89/57 105/64 89/50  Pulse:  45 55 85  Temp: 97.9 F (36.6 C)     TempSrc: Oral     Resp:  3 10 13   Height:      Weight:      SpO2:  93% 93% 92%   Body mass index is 25.95 kg/(m^2).  General: He is sleeping quietly in bed.  Lungs: Clear Cor: Regular S1 and S2 with no murmurs  Lab Results Lab Results  Component Value Date   WBC 8.2 02/13/2015   HGB 13.3 02/13/2015   HCT 39.6 02/13/2015   MCV 94.3 02/13/2015   PLT 216 02/13/2015    Lab Results  Component Value Date   CREATININE 1.05 02/10/2015   BUN 11 02/10/2015   NA 135 02/10/2015   K 4.0 02/10/2015   CL 102 02/10/2015   CO2 22 02/10/2015   No results found for: ALT, AST, GGT, ALKPHOS, BILITOT   Microbiology: Recent Results (from the past 240 hour(s))  Blood culture (routine x 2)     Status: None   Collection Time: 02/10/15  7:37 PM  Result Value Ref Range Status   Specimen Description BLOOD LEFT ARM  Final   Special Requests BOTTLES DRAWN AEROBIC AND ANAEROBIC 6CC  Final   Culture  Setup Time   Final    GRAM POSITIVE COCCI IN CLUSTERS ANAEROBIC BOTTLE ONLY CRITICAL RESULT CALLED TO, READ BACK BY AND VERIFIED WITH: J.CARMICHAEL,RN 02/11/15 @2135  BY  V.WILKINS CONFIRMED BY T.C[place] Gram Stain Report Called to,Read Back By and Verified With: [place] K. AT 9476 ON [id number] BY THOMPSON S.    Culture   Final    STAPHYLOCOCCUS AUREUS Performed at [facility]    Report Status 02/13/2015 FINAL  Final   Organism ID, Bacteria STAPHYLOCOCCUS AUREUS  Final      Susceptibility   Staphylococcus aureus - MIC*    CIPROFLOXACIN <=0.5 SENSITIVE Sensitive     ERYTHROMYCIN <=0.25 SENSITIVE Sensitive     GENTAMICIN <=0.5 SENSITIVE Sensitive     OXACILLIN <=0.25 SENSITIVE Sensitive     TETRACYCLINE <=1 SENSITIVE Sensitive     VANCOMYCIN 1 SENSITIVE Sensitive     TRIMETH/SULFA <=10 SENSITIVE Sensitive     CLINDAMYCIN <=0.25 SENSITIVE Sensitive     RIFAMPIN <=0.5 SENSITIVE Sensitive     Inducible Clindamycin NEGATIVE Sensitive     * STAPHYLOCOCCUS AUREUS  Blood culture (routine x 2)     Status: None   Collection Time: 02/10/15  7:40 PM  Result Value Ref Range Status   Specimen Description BLOOD LEFT HAND  Final  Special Requests BOTTLES DRAWN AEROBIC AND ANAEROBIC 6CC  Final   Culture NO GROWTH 5 DAYS  Final   Report Status 02/15/2015 FINAL  Final  MRSA PCR Screening     Status: None   Collection Time: 02/11/15  9:47 PM  Result Value Ref Range Status   MRSA by PCR NEGATIVE NEGATIVE Final    Comment:        The GeneXpert MRSA Assay (FDA approved for NASAL specimens only), is one component of a comprehensive MRSA colonization surveillance program. It is not intended to diagnose MRSA infection nor to guide or monitor treatment for MRSA infections.   Culture, blood (routine x 2)     Status: None (Preliminary result)   Collection Time: 02/12/15 12:50 AM  Result Value Ref Range Status   Specimen Description BLOOD LEFT ANTECUBITAL  Final   Special Requests BOTTLES DRAWN AEROBIC AND ANAEROBIC 10CC  Final   Culture NO GROWTH 4 DAYS  Final   Report Status PENDING  Incomplete  Culture, blood (routine x 2)     Status: None  (Preliminary result)   Collection Time: 02/12/15 12:55 AM  Result Value Ref Range Status   Specimen Description BLOOD LEFT HAND  Final   Special Requests BOTTLES DRAWN AEROBIC ONLY 5CC  Final   Culture NO GROWTH 4 DAYS  Final   Report Status PENDING  Incomplete  Blood Cultures x 2 sites     Status: None (Preliminary result)   Collection Time: 02/13/15 10:23 AM  Result Value Ref Range Status   Specimen Description BLOOD LEFT ANTECUBITAL  Final   Special Requests BOTTLES DRAWN AEROBIC AND ANAEROBIC  10CC  Final   Culture NO GROWTH 3 DAYS  Final   Report Status PENDING  Incomplete  Blood Cultures x 2 sites     Status: None (Preliminary result)   Collection Time: 02/13/15 10:31 AM  Result Value Ref Range Status   Specimen Description BLOOD LEFT HAND  Final   Special Requests BOTTLES DRAWN AEROBIC ONLY 2CC  Final   Culture NO GROWTH 3 DAYS  Final   Report Status PENDING  Incomplete  Anaerobic culture     Status: None (Preliminary result)   Collection Time: 02/14/15  8:14 PM  Result Value Ref Range Status   Specimen Description WOUND BACK  Final   Special Requests NONE  Final   Gram Stain   Final    FEW WBC PRESENT,BOTH PMN AND MONONUCLEAR NO SQUAMOUS EPITHELIAL CELLS SEEN RARE GRAM POSITIVE COCCI IN PAIRS Performed at [facility]    Culture   Final    NO ANAEROBES ISOLATED; CULTURE IN PROGRESS FOR 5 DAYS Performed at [facility]    Report Status PENDING  Incomplete  Anaerobic culture     Status: None (Preliminary result)   Collection Time: 02/14/15  8:14 PM  Result Value Ref Range Status   Specimen Description TISSUE  Final   Special Requests BACK  Final   Gram Stain   Final    RARE WBC PRESENT, PREDOMINANTLY PMN NO ORGANISMS SEEN Performed at [facility]    Culture   Final    NO ANAEROBES ISOLATED; CULTURE IN PROGRESS FOR 5 DAYS Performed at [facility]    Report Status PENDING  Incomplete  Fungus Culture with Smear     Status: None  (Preliminary result)   Collection Time: 02/14/15  8:14 PM  Result Value Ref Range Status   Specimen Description WOUND BACK  Final  Special Requests NONE  Final   Fungal Smear   Final    NO YEAST OR FUNGAL ELEMENTS SEEN Performed at [facility]    Culture   Final    CULTURE IN PROGRESS FOR FOUR WEEKS Performed at [facility]    Report Status PENDING  Incomplete  Fungus Culture with Smear     Status: None (Preliminary result)   Collection Time: 02/14/15  8:14 PM  Result Value Ref Range Status   Specimen Description TISSUE  Final   Special Requests BACK  Final   Fungal Smear   Final    NO YEAST OR FUNGAL ELEMENTS SEEN Performed at [facility]    Culture   Final    CULTURE IN PROGRESS FOR FOUR WEEKS Performed at [facility]    Report Status PENDING  Incomplete  Gram stain     Status: None   Collection Time: 02/14/15  8:14 PM  Result Value Ref Range Status   Specimen Description WOUND BACK  Final   Special Requests NONE  Final   Gram Stain   Final    FEW WBC PRESENT,BOTH PMN AND MONONUCLEAR RARE GRAM POSITIVE COCCI IN PAIRS Gram Stain Report Called to,Read Back By and Verified With: Nicholaus Bloom RN 2234 02/14/15 A BROWNING    Report Status 02/14/2015 FINAL  Final  Gram stain     Status: None   Collection Time: 02/14/15  8:14 PM  Result Value Ref Range Status   Specimen Description TISSUE BACK  Final   Special Requests NONE  Final   Gram Stain   Final    FEW WBC PRESENT,BOTH PMN AND MONONUCLEAR RARE GRAM POSITIVE COCCI IN PAIRS Gram Stain Report Called to,Read Back By and Verified With: Nicholaus Bloom RN 2234 02/14/15 A BROWNING    Report Status 02/14/2015 FINAL  Final  Wound culture     Status: None (Preliminary result)   Collection Time: 02/14/15  8:14 PM  Result Value Ref Range Status   Specimen Description WOUND BACK  Final   Special Requests NONE  Final   Gram Stain   Final    FEW WBC PRESENT,BOTH PMN AND MONONUCLEAR NO SQUAMOUS  EPITHELIAL CELLS SEEN RARE GRAM POSITIVE COCCI IN PAIRS Gram Stain Report Called to,Read Back By and Verified With: Gram Stain Report Called to,Read Back By and Verified With: Nicholaus Bloom RN 2234 02/14/15 BY A BROWNING Performed at [facility] Performed at [facility]    Culture   Final    FEW STAPHYLOCOCCUS AUREUS Note: RIFAMPIN AND GENTAMICIN SHOULD NOT BE USED AS SINGLE DRUGS FOR TREATMENT OF STAPH INFECTIONS. Performed at [facility]    Report Status PENDING  Incomplete  AFB culture with smear     Status: None (Preliminary result)   Collection Time: 02/14/15  8:14 PM  Result Value Ref Range Status   Specimen Description WOUND BACK  Final   Special Requests NONE  Final   Acid Fast Smear   Final    NO ACID FAST BACILLI SEEN Performed at [facility]    Culture   Final    CULTURE WILL BE EXAMINED FOR 6 WEEKS BEFORE ISSUING A FINAL REPORT Performed at [facility]    Report Status PENDING  Incomplete  Tissue culture     Status: None (Preliminary result)   Collection Time: 02/14/15  8:14 PM  Result Value Ref Range Status   Specimen Description TISSUE BACK  Final   Special  Requests NONE  Final   Gram Stain   Final    RARE WBC PRESENT, PREDOMINANTLY PMN NO ORGANISMS SEEN Performed at [facility]    Culture   Final    NO GROWTH 1 DAY Performed at [facility]    Report Status PENDING  Incomplete  AFB culture with smear     Status: None (Preliminary result)   Collection Time: 02/14/15  9:04 PM  Result Value Ref Range Status   Specimen Description TISSUE  Final   Special Requests BACK  Final   Acid Fast Smear   Final    NO ACID FAST BACILLI SEEN Performed at [facility]    Culture   Final    CULTURE WILL BE EXAMINED FOR 6 WEEKS BEFORE ISSUING A FINAL REPORT Performed at [facility]    Report Status PENDING  Incomplete    Assessment: He has MSSA epidural abscess complicated by transient  bacteremia following his recent surgery.  Plan: 1. Continue cefazolin pending final operative culture results 2. PICC placement  Michel Bickers, MD [facility] [phone] pager   [phone] cell 02/16/2015, 2:41 PM

## 2015-02-17 LAB — WOUND CULTURE

## 2015-02-17 LAB — CULTURE, BLOOD (ROUTINE X 2)
Culture: NO GROWTH
Culture: NO GROWTH

## 2015-02-17 MED ORDER — BISACODYL 5 MG PO TBEC
5.0000 mg | DELAYED_RELEASE_TABLET | Freq: Every day | ORAL | Status: DC | PRN
  Administered 2015-02-17 – 2015-02-22 (×2): 5 mg via ORAL
  Filled 2015-02-17 (×2): qty 1

## 2015-02-17 NOTE — Progress Notes (Signed)
Patient ID: James Hays, male   DOB: 1951-03-19, 63 y.o.   MRN: [id number] Stable,c/o lumbar pain. No fever. Lumbar drain working well. Spoke with wife. Remains with hob down till [facility]

## 2015-02-17 NOTE — Progress Notes (Signed)
Patient ID: James Hays, male   DOB: 1950/08/24, 64 y.o.   MRN: [id number]         [facility]    Date of Admission:  02/10/2015   Total days of antibiotics 7          Principal Problem:   Abscess in epidural space of lumbar spine Active Problems:   Staphylococcus aureus bacteremia   Post-operative complication   Postoperative CSF leak   Surgery, other elective   S/P diskectomy   Other postoperative complication involving nervous system   .  ceFAZolin (ANCEF) IV  2 g Intravenous Q8H  . cholecalciferol  1,000 Units Oral Daily  . heparin subcutaneous  5,000 Units Subcutaneous 3 times per day  . sodium chloride  3 mL Intravenous Q12H    SUBJECTIVE: He is still in quite a bit of pain but is starting to feel a little bit better  OBJECTIVE: Filed Vitals:   02/17/15 1100 02/17/15 1147 02/17/15 1200 02/17/15 1300  BP: 112/72  112/65 115/59  Pulse: 52  68 75  Temp:  97.9 F (36.6 C)    TempSrc:  Oral    Resp: 13  11 9   Height:      Weight:      SpO2: 93%  94% 90%   Body mass index is 25.95 kg/(m^2).  General: He is resting quietly in bed with his head down.  Lungs: Clear Cor: Regular S1 and S2 with no murmurs  Lab Results Lab Results  Component Value Date   WBC 8.2 02/13/2015   HGB 13.3 02/13/2015   HCT 39.6 02/13/2015   MCV 94.3 02/13/2015   PLT 216 02/13/2015    Lab Results  Component Value Date   CREATININE 1.05 02/10/2015   BUN 11 02/10/2015   NA 135 02/10/2015   K 4.0 02/10/2015   CL 102 02/10/2015   CO2 22 02/10/2015   No results found for: ALT, AST, GGT, ALKPHOS, BILITOT   Microbiology: Recent Results (from the past 240 hour(s))  Blood culture (routine x 2)     Status: None   Collection Time: 02/10/15  7:37 PM  Result Value Ref Range Status   Specimen Description BLOOD LEFT ARM  Final   Special Requests BOTTLES DRAWN AEROBIC AND ANAEROBIC 6CC  Final   Culture  Setup Time   Final    GRAM POSITIVE COCCI IN  CLUSTERS ANAEROBIC BOTTLE ONLY CRITICAL RESULT CALLED TO, READ BACK BY AND VERIFIED WITH: J.CARMICHAEL,RN 02/11/15 @2135  BY V.WILKINS CONFIRMED BY T.C[place] Gram Stain Report Called to,Read Back By and Verified With: [place] K. AT 0102 ON [id number] BY THOMPSON S.    Culture   Final    STAPHYLOCOCCUS AUREUS Performed at [facility]    Report Status 02/13/2015 FINAL  Final   Organism ID, Bacteria STAPHYLOCOCCUS AUREUS  Final      Susceptibility   Staphylococcus aureus - MIC*    CIPROFLOXACIN <=0.5 SENSITIVE Sensitive     ERYTHROMYCIN <=0.25 SENSITIVE Sensitive     GENTAMICIN <=0.5 SENSITIVE Sensitive     OXACILLIN <=0.25 SENSITIVE Sensitive     TETRACYCLINE <=1 SENSITIVE Sensitive     VANCOMYCIN 1 SENSITIVE Sensitive     TRIMETH/SULFA <=10 SENSITIVE Sensitive     CLINDAMYCIN <=0.25 SENSITIVE Sensitive     RIFAMPIN <=0.5 SENSITIVE Sensitive     Inducible Clindamycin NEGATIVE Sensitive     * STAPHYLOCOCCUS AUREUS  Blood culture (routine x 2)     Status: None  Collection Time: 02/10/15  7:40 PM  Result Value Ref Range Status   Specimen Description BLOOD LEFT HAND  Final   Special Requests BOTTLES DRAWN AEROBIC AND ANAEROBIC 6CC  Final   Culture NO GROWTH 5 DAYS  Final   Report Status 02/15/2015 FINAL  Final  MRSA PCR Screening     Status: None   Collection Time: 02/11/15  9:47 PM  Result Value Ref Range Status   MRSA by PCR NEGATIVE NEGATIVE Final    Comment:        The GeneXpert MRSA Assay (FDA approved for NASAL specimens only), is one component of a comprehensive MRSA colonization surveillance program. It is not intended to diagnose MRSA infection nor to guide or monitor treatment for MRSA infections.   Culture, blood (routine x 2)     Status: None (Preliminary result)   Collection Time: 02/12/15 12:50 AM  Result Value Ref Range Status   Specimen Description BLOOD LEFT ANTECUBITAL  Final   Special Requests BOTTLES DRAWN AEROBIC AND ANAEROBIC 10CC  Final    Culture NO GROWTH 4 DAYS  Final   Report Status PENDING  Incomplete  Culture, blood (routine x 2)     Status: None (Preliminary result)   Collection Time: 02/12/15 12:55 AM  Result Value Ref Range Status   Specimen Description BLOOD LEFT HAND  Final   Special Requests BOTTLES DRAWN AEROBIC ONLY 5CC  Final   Culture NO GROWTH 4 DAYS  Final   Report Status PENDING  Incomplete  Blood Cultures x 2 sites     Status: None (Preliminary result)   Collection Time: 02/13/15 10:23 AM  Result Value Ref Range Status   Specimen Description BLOOD LEFT ANTECUBITAL  Final   Special Requests BOTTLES DRAWN AEROBIC AND ANAEROBIC  10CC  Final   Culture NO GROWTH 3 DAYS  Final   Report Status PENDING  Incomplete  Blood Cultures x 2 sites     Status: None (Preliminary result)   Collection Time: 02/13/15 10:31 AM  Result Value Ref Range Status   Specimen Description BLOOD LEFT HAND  Final   Special Requests BOTTLES DRAWN AEROBIC ONLY 2CC  Final   Culture NO GROWTH 3 DAYS  Final   Report Status PENDING  Incomplete  Anaerobic culture     Status: None (Preliminary result)   Collection Time: 02/14/15  8:14 PM  Result Value Ref Range Status   Specimen Description WOUND BACK  Final   Special Requests NONE  Final   Gram Stain   Final    FEW WBC PRESENT,BOTH PMN AND MONONUCLEAR NO SQUAMOUS EPITHELIAL CELLS SEEN RARE GRAM POSITIVE COCCI IN PAIRS Performed at [facility]    Culture   Final    NO ANAEROBES ISOLATED; CULTURE IN PROGRESS FOR 5 DAYS Performed at [facility]    Report Status PENDING  Incomplete  Anaerobic culture     Status: None (Preliminary result)   Collection Time: 02/14/15  8:14 PM  Result Value Ref Range Status   Specimen Description TISSUE  Final   Special Requests BACK  Final   Gram Stain   Final    RARE WBC PRESENT, PREDOMINANTLY PMN NO ORGANISMS SEEN Performed at [facility]    Culture   Final    NO ANAEROBES ISOLATED; CULTURE IN PROGRESS FOR 5  DAYS Performed at [facility]    Report Status PENDING  Incomplete  Fungus Culture with Smear     Status: None (Preliminary result)  Collection Time: 02/14/15  8:14 PM  Result Value Ref Range Status   Specimen Description WOUND BACK  Final   Special Requests NONE  Final   Fungal Smear   Final    NO YEAST OR FUNGAL ELEMENTS SEEN Performed at [facility]    Culture   Final    CULTURE IN PROGRESS FOR FOUR WEEKS Performed at [facility]    Report Status PENDING  Incomplete  Fungus Culture with Smear     Status: None (Preliminary result)   Collection Time: 02/14/15  8:14 PM  Result Value Ref Range Status   Specimen Description TISSUE  Final   Special Requests BACK  Final   Fungal Smear   Final    NO YEAST OR FUNGAL ELEMENTS SEEN Performed at [facility]    Culture   Final    CULTURE IN PROGRESS FOR FOUR WEEKS Performed at [facility]    Report Status PENDING  Incomplete  Gram stain     Status: None   Collection Time: 02/14/15  8:14 PM  Result Value Ref Range Status   Specimen Description WOUND BACK  Final   Special Requests NONE  Final   Gram Stain   Final    FEW WBC PRESENT,BOTH PMN AND MONONUCLEAR RARE GRAM POSITIVE COCCI IN PAIRS Gram Stain Report Called to,Read Back By and Verified With: Nicholaus Bloom RN 2234 02/14/15 A BROWNING    Report Status 02/14/2015 FINAL  Final  Gram stain     Status: None   Collection Time: 02/14/15  8:14 PM  Result Value Ref Range Status   Specimen Description TISSUE BACK  Final   Special Requests NONE  Final   Gram Stain   Final    FEW WBC PRESENT,BOTH PMN AND MONONUCLEAR RARE GRAM POSITIVE COCCI IN PAIRS Gram Stain Report Called to,Read Back By and Verified With: Nicholaus Bloom RN 2234 02/14/15 A BROWNING    Report Status 02/14/2015 FINAL  Final  Wound culture     Status: None   Collection Time: 02/14/15  8:14 PM  Result Value Ref Range Status   Specimen Description WOUND BACK  Final   Special  Requests NONE  Final   Gram Stain   Final    FEW WBC PRESENT,BOTH PMN AND MONONUCLEAR NO SQUAMOUS EPITHELIAL CELLS SEEN RARE GRAM POSITIVE COCCI IN PAIRS Gram Stain Report Called to,Read Back By and Verified With: Gram Stain Report Called to,Read Back By and Verified With: Nicholaus Bloom RN 2234 02/14/15 BY A BROWNING Performed at [facility] Performed at [facility]    Culture   Final    FEW STAPHYLOCOCCUS AUREUS Note: RIFAMPIN AND GENTAMICIN SHOULD NOT BE USED AS SINGLE DRUGS FOR TREATMENT OF STAPH INFECTIONS. Performed at [facility]    Report Status 02/17/2015 FINAL  Final   Organism ID, Bacteria STAPHYLOCOCCUS AUREUS  Final      Susceptibility   Staphylococcus aureus - MIC*    CLINDAMYCIN <=0.25 SENSITIVE Sensitive     ERYTHROMYCIN <=0.25 SENSITIVE Sensitive     GENTAMICIN <=0.5 SENSITIVE Sensitive     LEVOFLOXACIN <=0.12 SENSITIVE Sensitive     OXACILLIN <=0.25 SENSITIVE Sensitive     RIFAMPIN <=0.5 SENSITIVE Sensitive     TRIMETH/SULFA <=10 SENSITIVE Sensitive     VANCOMYCIN <=0.5 SENSITIVE Sensitive     TETRACYCLINE <=1 SENSITIVE Sensitive     MOXIFLOXACIN <=0.25 SENSITIVE Sensitive     * FEW STAPHYLOCOCCUS AUREUS  AFB culture with smear  Status: None (Preliminary result)   Collection Time: 02/14/15  8:14 PM  Result Value Ref Range Status   Specimen Description WOUND BACK  Final   Special Requests NONE  Final   Acid Fast Smear   Final    NO ACID FAST BACILLI SEEN Performed at [facility]    Culture   Final    CULTURE WILL BE EXAMINED FOR 6 WEEKS BEFORE ISSUING A FINAL REPORT Performed at [facility]    Report Status PENDING  Incomplete  Tissue culture     Status: None (Preliminary result)   Collection Time: 02/14/15  8:14 PM  Result Value Ref Range Status   Specimen Description TISSUE BACK  Final   Special Requests NONE  Final   Gram Stain   Final    RARE WBC PRESENT, PREDOMINANTLY PMN NO ORGANISMS SEEN Performed at  [facility]    Culture   Final    NO GROWTH 2 DAYS Performed at [facility]    Report Status PENDING  Incomplete  AFB culture with smear     Status: None (Preliminary result)   Collection Time: 02/14/15  9:04 PM  Result Value Ref Range Status   Specimen Description TISSUE  Final   Special Requests BACK  Final   Acid Fast Smear   Final    NO ACID FAST BACILLI SEEN Performed at [facility]    Culture   Final    CULTURE WILL BE EXAMINED FOR 6 WEEKS BEFORE ISSUING A FINAL REPORT Performed at [facility]    Report Status PENDING  Incomplete    Assessment: He has MSSA epidural abscess complicated by transient bacteremia following his recent surgery. He is starting to improve. Operative cultures are growing staph aureus now.  Plan: 1. Continue cefazolin  2. PICC placement 3. Please call me for any infectious disease questions this weekend  Michel Bickers, MD [facility] for [place] [phone] pager   [phone] cell 02/17/2015, 1:32 PM

## 2015-02-17 NOTE — Progress Notes (Signed)
Patient ID: Spoke with wife

## 2015-02-18 LAB — TISSUE CULTURE: CULTURE: NO GROWTH

## 2015-02-18 LAB — CULTURE, BLOOD (ROUTINE X 2)
CULTURE: NO GROWTH
Culture: NO GROWTH

## 2015-02-18 MED ORDER — SODIUM CHLORIDE 0.9 % IJ SOLN
10.0000 mL | Freq: Two times a day (BID) | INTRAMUSCULAR | Status: DC
  Administered 2015-02-18 – 2015-02-20 (×5): 10 mL
  Administered 2015-02-21: 30 mL
  Administered 2015-02-21 – 2015-02-23 (×3): 10 mL

## 2015-02-18 MED ORDER — SODIUM CHLORIDE 0.9 % IJ SOLN
10.0000 mL | INTRAMUSCULAR | Status: DC | PRN
  Administered 2015-02-23 – 2015-02-24 (×2): 10 mL
  Filled 2015-02-18 (×2): qty 40

## 2015-02-18 NOTE — Progress Notes (Signed)
No acute events. Drain working well (158 out yesterday) Afebrile, VSS. Continue head of bed flat, keep drain.

## 2015-02-18 NOTE — Progress Notes (Signed)
Peripherally Inserted Central Catheter/Midline Placement  The IV Nurse has discussed with the patient and/or persons authorized to consent for the patient, the purpose of this procedure and the potential benefits and risks involved with this procedure.  The benefits include less needle sticks, lab draws from the catheter and patient may be discharged home with the catheter.  Risks include, but not limited to, infection, bleeding, blood clot (thrombus formation), and puncture of an artery; nerve damage and irregular heat beat.  Alternatives to this procedure were also discussed.  PICC/Midline Placement Documentation  PICC / Midline Single Lumen 76/80/88 PICC Right Basilic 40 cm 0 cm (Active)  Indication for Insertion or Continuance of Line Home intravenous therapies (PICC only) 02/18/2015  9:29 AM  Exposed Catheter (cm) 0 cm 02/18/2015  9:29 AM  Site Assessment Clean;Dry;Intact 02/18/2015  9:29 AM  Line Status Flushed;Saline locked;Blood return noted 02/18/2015  9:29 AM  Dressing Type Transparent 02/18/2015  9:29 AM  Dressing Status Clean;Dry;Intact 02/18/2015  9:29 AM  Dressing Change Due 02/25/15 02/18/2015  9:29 AM       Gordan Payment 02/18/2015, 9:31 AM

## 2015-02-19 LAB — ANAEROBIC CULTURE

## 2015-02-19 MED ORDER — ENSURE ENLIVE PO LIQD
237.0000 mL | Freq: Two times a day (BID) | ORAL | Status: DC
  Administered 2015-02-19 – 2015-02-23 (×7): 237 mL via ORAL
  Filled 2015-02-19 (×7): qty 237

## 2015-02-19 NOTE — Progress Notes (Signed)
No acute overnight events. Drain working well overnight (146 out yesterday), but not functioning right now.  No kink in tube. Afebrile, VSS. Continue head of bed flat, keep drain.

## 2015-02-20 NOTE — Progress Notes (Signed)
Patient ID: James Hays, male   DOB: 12-Jan-1951, 64 y.o.   MRN: [id number]         [facility]    Date of Admission:  02/10/2015   Total days of antibiotics 10         Principal Problem:   Abscess in epidural space of lumbar spine Active Problems:   Staphylococcus aureus bacteremia   Post-operative complication   Postoperative CSF leak   Surgery, other elective   S/P diskectomy   Other postoperative complication involving nervous system   .  ceFAZolin (ANCEF) IV  2 g Intravenous Q8H  . cholecalciferol  1,000 Units Oral Daily  . feeding supplement (ENSURE ENLIVE)  237 mL Oral BID BM  . heparin subcutaneous  5,000 Units Subcutaneous 3 times per day  . sodium chloride  10-40 mL Intracatheter Q12H  . sodium chloride  3 mL Intravenous Q12H    SUBJECTIVE: He states that he is feeling a little bit better. He says that his back pain is now "livable".  Review of Systems: Pertinent items are noted in HPI.  Past Medical History  Diagnosis Date  . Hyperlipidemia     Social History  Substance Use Topics  . Smoking status: Current Every Day Smoker -- 1.00 packs/day    Types: Cigarettes  . Smokeless tobacco: Never Used  . Alcohol Use: No    Family History  Problem Relation Age of Onset  . Hypertension Mother   . Diabetes Mother   . Heart disease Father 67  . Hyperlipidemia Father   . Diabetes Brother    No Known Allergies  OBJECTIVE: Filed Vitals:   02/20/15 0200 02/20/15 0400 02/20/15 0600 02/20/15 0800  BP: 112/61 115/67 148/76   Pulse: 65 47 57   Temp:  98.1 F (36.7 C)  99.5 F (37.5 C)  TempSrc:  Axillary  Oral  Resp: 11 11 18    Height:      Weight:      SpO2: 96% 95% 96%    Body mass index is 25.95 kg/(m^2).  General: he is alert and smiling. His wife is at the bedside. Lungs: clear Cor: regular S1 and S2 with no murmurs  Lab Results Lab Results  Component Value Date   WBC 8.2 02/13/2015   HGB 13.3 02/13/2015   HCT  39.6 02/13/2015   MCV 94.3 02/13/2015   PLT 216 02/13/2015    Lab Results  Component Value Date   CREATININE 1.05 02/10/2015   BUN 11 02/10/2015   NA 135 02/10/2015   K 4.0 02/10/2015   CL 102 02/10/2015   CO2 22 02/10/2015   No results found for: ALT, AST, GGT, ALKPHOS, BILITOT   SED RATE (mm/hr)  Date Value  02/13/2015 88*  02/10/2015 95*   CRP (mg/dL)  Date Value  02/13/2015 14.4*    Microbiology: Recent Results (from the past 240 hour(s))  Blood culture (routine x 2)     Status: None   Collection Time: 02/10/15  7:37 PM  Result Value Ref Range Status   Specimen Description BLOOD LEFT ARM  Final   Special Requests BOTTLES DRAWN AEROBIC AND ANAEROBIC 6CC  Final   Culture  Setup Time   Final    GRAM POSITIVE COCCI IN CLUSTERS ANAEROBIC BOTTLE ONLY CRITICAL RESULT CALLED TO, READ BACK BY AND VERIFIED WITH: J.CARMICHAEL,RN 02/11/15 @2135  BY V.WILKINS CONFIRMED BY T.C[place] Gram Stain Report Called to,Read Back By and Verified With: Weldon K. AT [place] [address] BY  THOMPSON S.    Culture   Final    STAPHYLOCOCCUS AUREUS Performed at [facility]    Report Status 02/13/2015 FINAL  Final   Organism ID, Bacteria STAPHYLOCOCCUS AUREUS  Final      Susceptibility   Staphylococcus aureus - MIC*    CIPROFLOXACIN <=0.5 SENSITIVE Sensitive     ERYTHROMYCIN <=0.25 SENSITIVE Sensitive     GENTAMICIN <=0.5 SENSITIVE Sensitive     OXACILLIN <=0.25 SENSITIVE Sensitive     TETRACYCLINE <=1 SENSITIVE Sensitive     VANCOMYCIN 1 SENSITIVE Sensitive     TRIMETH/SULFA <=10 SENSITIVE Sensitive     CLINDAMYCIN <=0.25 SENSITIVE Sensitive     RIFAMPIN <=0.5 SENSITIVE Sensitive     Inducible Clindamycin NEGATIVE Sensitive     * STAPHYLOCOCCUS AUREUS  Blood culture (routine x 2)     Status: None   Collection Time: 02/10/15  7:40 PM  Result Value Ref Range Status   Specimen Description BLOOD LEFT HAND  Final   Special Requests BOTTLES DRAWN AEROBIC AND ANAEROBIC 6CC  Final    Culture NO GROWTH 5 DAYS  Final   Report Status 02/15/2015 FINAL  Final  MRSA PCR Screening     Status: None   Collection Time: 02/11/15  9:47 PM  Result Value Ref Range Status   MRSA by PCR NEGATIVE NEGATIVE Final    Comment:        The GeneXpert MRSA Assay (FDA approved for NASAL specimens only), is one component of a comprehensive MRSA colonization surveillance program. It is not intended to diagnose MRSA infection nor to guide or monitor treatment for MRSA infections.   Culture, blood (routine x 2)     Status: None   Collection Time: 02/12/15 12:50 AM  Result Value Ref Range Status   Specimen Description BLOOD LEFT ANTECUBITAL  Final   Special Requests BOTTLES DRAWN AEROBIC AND ANAEROBIC 10CC  Final   Culture NO GROWTH 5 DAYS  Final   Report Status 02/17/2015 FINAL  Final  Culture, blood (routine x 2)     Status: None   Collection Time: 02/12/15 12:55 AM  Result Value Ref Range Status   Specimen Description BLOOD LEFT HAND  Final   Special Requests BOTTLES DRAWN AEROBIC ONLY 5CC  Final   Culture NO GROWTH 5 DAYS  Final   Report Status 02/17/2015 FINAL  Final  Blood Cultures x 2 sites     Status: None   Collection Time: 02/13/15 10:23 AM  Result Value Ref Range Status   Specimen Description BLOOD LEFT ANTECUBITAL  Final   Special Requests BOTTLES DRAWN AEROBIC AND ANAEROBIC  10CC  Final   Culture NO GROWTH 5 DAYS  Final   Report Status 02/18/2015 FINAL  Final  Blood Cultures x 2 sites     Status: None   Collection Time: 02/13/15 10:31 AM  Result Value Ref Range Status   Specimen Description BLOOD LEFT HAND  Final   Special Requests BOTTLES DRAWN AEROBIC ONLY 2CC  Final   Culture NO GROWTH 5 DAYS  Final   Report Status 02/18/2015 FINAL  Final  Anaerobic culture     Status: None   Collection Time: 02/14/15  8:14 PM  Result Value Ref Range Status   Specimen Description WOUND BACK  Final   Special Requests NONE  Final   Gram Stain   Final    FEW WBC PRESENT,BOTH PMN  AND MONONUCLEAR NO SQUAMOUS EPITHELIAL CELLS SEEN RARE GRAM POSITIVE COCCI IN PAIRS Performed at  [facility]    Culture   Final    NO ANAEROBES ISOLATED Performed at [facility]    Report Status 02/19/2015 FINAL  Final  Anaerobic culture     Status: None   Collection Time: 02/14/15  8:14 PM  Result Value Ref Range Status   Specimen Description TISSUE  Final   Special Requests BACK  Final   Gram Stain   Final    RARE WBC PRESENT, PREDOMINANTLY PMN NO ORGANISMS SEEN Performed at [facility]    Culture   Final    NO ANAEROBES ISOLATED Performed at [facility]    Report Status 02/19/2015 FINAL  Final  Fungus Culture with Smear     Status: None (Preliminary result)   Collection Time: 02/14/15  8:14 PM  Result Value Ref Range Status   Specimen Description WOUND BACK  Final   Special Requests NONE  Final   Fungal Smear   Final    NO YEAST OR FUNGAL ELEMENTS SEEN Performed at [facility]    Culture   Final    CULTURE IN PROGRESS FOR FOUR WEEKS Performed at [facility]    Report Status PENDING  Incomplete  Fungus Culture with Smear     Status: None (Preliminary result)   Collection Time: 02/14/15  8:14 PM  Result Value Ref Range Status   Specimen Description TISSUE  Final   Special Requests BACK  Final   Fungal Smear   Final    NO YEAST OR FUNGAL ELEMENTS SEEN Performed at [facility]    Culture   Final    CULTURE IN PROGRESS FOR FOUR WEEKS Performed at [facility]    Report Status PENDING  Incomplete  Gram stain     Status: None   Collection Time: 02/14/15  8:14 PM  Result Value Ref Range Status   Specimen Description WOUND BACK  Final   Special Requests NONE  Final   Gram Stain   Final    FEW WBC PRESENT,BOTH PMN AND MONONUCLEAR RARE GRAM POSITIVE COCCI IN PAIRS Gram Stain Report Called to,Read Back By and Verified With: Nicholaus Bloom RN 2234 02/14/15 A BROWNING    Report Status 02/14/2015 FINAL   Final  Gram stain     Status: None   Collection Time: 02/14/15  8:14 PM  Result Value Ref Range Status   Specimen Description TISSUE BACK  Final   Special Requests NONE  Final   Gram Stain   Final    FEW WBC PRESENT,BOTH PMN AND MONONUCLEAR RARE GRAM POSITIVE COCCI IN PAIRS Gram Stain Report Called to,Read Back By and Verified With: Nicholaus Bloom RN 2234 02/14/15 A BROWNING    Report Status 02/14/2015 FINAL  Final  Wound culture     Status: None   Collection Time: 02/14/15  8:14 PM  Result Value Ref Range Status   Specimen Description WOUND BACK  Final   Special Requests NONE  Final   Gram Stain   Final    FEW WBC PRESENT,BOTH PMN AND MONONUCLEAR NO SQUAMOUS EPITHELIAL CELLS SEEN RARE GRAM POSITIVE COCCI IN PAIRS Gram Stain Report Called to,Read Back By and Verified With: Gram Stain Report Called to,Read Back By and Verified With: Nicholaus Bloom RN 2234 02/14/15 BY A BROWNING Performed at [facility] Performed at [facility]    Culture   Final    FEW STAPHYLOCOCCUS AUREUS Note: RIFAMPIN AND GENTAMICIN SHOULD NOT BE USED AS SINGLE DRUGS  FOR TREATMENT OF STAPH INFECTIONS. Performed at [facility]    Report Status 02/17/2015 FINAL  Final   Organism ID, Bacteria STAPHYLOCOCCUS AUREUS  Final      Susceptibility   Staphylococcus aureus - MIC*    CLINDAMYCIN <=0.25 SENSITIVE Sensitive     ERYTHROMYCIN <=0.25 SENSITIVE Sensitive     GENTAMICIN <=0.5 SENSITIVE Sensitive     LEVOFLOXACIN <=0.12 SENSITIVE Sensitive     OXACILLIN <=0.25 SENSITIVE Sensitive     RIFAMPIN <=0.5 SENSITIVE Sensitive     TRIMETH/SULFA <=10 SENSITIVE Sensitive     VANCOMYCIN <=0.5 SENSITIVE Sensitive     TETRACYCLINE <=1 SENSITIVE Sensitive     MOXIFLOXACIN <=0.25 SENSITIVE Sensitive     * FEW STAPHYLOCOCCUS AUREUS  AFB culture with smear     Status: None (Preliminary result)   Collection Time: 02/14/15  8:14 PM  Result Value Ref Range Status   Specimen Description WOUND BACK  Final   Special  Requests NONE  Final   Acid Fast Smear   Final    NO ACID FAST BACILLI SEEN Performed at [facility]    Culture   Final    CULTURE WILL BE EXAMINED FOR 6 WEEKS BEFORE ISSUING A FINAL REPORT Performed at [facility]    Report Status PENDING  Incomplete  Tissue culture     Status: None   Collection Time: 02/14/15  8:14 PM  Result Value Ref Range Status   Specimen Description TISSUE BACK  Final   Special Requests NONE  Final   Gram Stain   Final    RARE WBC PRESENT, PREDOMINANTLY PMN NO ORGANISMS SEEN Performed at [facility]    Culture   Final    NO GROWTH 3 DAYS Performed at [facility]    Report Status 02/18/2015 FINAL  Final  AFB culture with smear     Status: None (Preliminary result)   Collection Time: 02/14/15  9:04 PM  Result Value Ref Range Status   Specimen Description TISSUE  Final   Special Requests BACK  Final   Acid Fast Smear   Final    NO ACID FAST BACILLI SEEN Performed at [facility]    Culture   Final    CULTURE WILL BE EXAMINED FOR 6 WEEKS BEFORE ISSUING A FINAL REPORT Performed at [facility]    Report Status PENDING  Incomplete     ASSESSMENT: He is improving slowly on therapy for MSSA lumbar infection complicated by epidural abscess and transient bacteremia.  PLAN: 1. Continue cefazolin for at least 6 weeks through 03/24/2015 2. I will sign off now and arrange followup in my clinic in about 4 weeks  Michel Bickers, MD [facility] [place] [phone] pager   [phone] cell 02/20/2015, 12:01 PM

## 2015-02-20 NOTE — Progress Notes (Signed)
Initial Nutrition Assessment  DOCUMENTATION CODES:   Not applicable  INTERVENTION:  Ensure Enlive po BID, each supplement provides 350 kcal and 20 grams of protein   NUTRITION DIAGNOSIS:   Inadequate oral intake related to wound healing, poor appetite as evidenced by meal completion < 50%, per patient/family report.    GOAL:   Patient will meet greater than or equal to 90% of their needs    MONITOR:   PO intake, Labs, Weight trends, I & O's, Skin  REASON FOR ASSESSMENT:   Malnutrition Screening Tool    ASSESSMENT:   Pt presents with an abcess s/p L3-4 microdisectomy. Displays meal completion below 50% during stay. h/o HLD & multiple surgeries.  Pt reports he does not eat 3 large meals a day, but 6 small meals throughout the day to control his weight and cholesterol. He said he has not been eating here because the medications along with large meals create bad tastes in his mouth that sour his appetite.  Pt exhibits muscle depletion at temple and hand, but no fat or muscle depletion anywhere else. Pt enjoys boost and ensure, says he drinks boost outpatient.  Does not appear nutritional compromised otherwise.  Will continue EEx2.  Diet Order:  Diet regular Room service appropriate?: Yes; Fluid consistency:: Thin  Skin:  Wound (see comment) (Abcess, epidural area of lumbar spine.)  Last BM:  02/09/2015  Height:   Ht Readings from Last 1 Encounters:  02/11/15 5\' 8"  (1.727 m)    Weight:   Wt Readings from Last 1 Encounters:  02/11/15 170 lb 10.2 oz (77.4 kg)    Ideal Body Weight:  70 kg  BMI:  Body mass index is 25.95 kg/(m^2).  Estimated Nutritional Needs:   Kcal:  1700-2000  Protein:  90-100  Fluid:  >=/ 1.7L  EDUCATION NEEDS:   No education needs identified at this time  Satira Anis. Keyarah Mcroy, MS, RD LDN After Hours/Weekend Pager [phone]

## 2015-02-20 NOTE — Progress Notes (Signed)
Patient ID: James Hays, male   DOB: 1951-05-18, 64 y.o.   MRN: [id number] Doing well. Lumbar drain closed since 1 pm. Wound dry. No headache. Dc drain in am. HOB to 15

## 2015-02-20 NOTE — Progress Notes (Signed)
Clamped Lumbar drain at 1645 and raised HOB to 5 degrees per Dr. [name].  Dr. [name] stated that he would be here in about 40 minutes.  Will continue to monitor pt.

## 2015-02-21 NOTE — Progress Notes (Signed)
Patient ID: James Hays, male   DOB: 1951/01/07, 64 y.o.   MRN: [id number] Doing well. Drain was removed. To be oob. No headache

## 2015-02-21 NOTE — Evaluation (Signed)
Physical Therapy Evaluation Patient Details Name: James Hays MRN: [id number] DOB: 30-Apr-1951 Today's Date: 02/21/2015   History of Present Illness  64 y.o. male transferred to [facility] after presenting to [facility] with wound drainage. He underwent right L3-4 microdiscectomy at the outpatient surgical center and now presents s/p CSF leak    Clinical Impression  Patient demonstrates deficits in functional mobility as indicated below. Will need continued skilled PT to address deficits and maximize function. Will see as indicated and progress as tolerated. Anticipate patient will progress well and may not need HHPT pending progress.      Follow Up Recommendations Home health PT;Supervision for mobility/OOB (pending progress may not need HHPT)    Equipment Recommendations  Rolling walker with 5" wheels    Recommendations for Other Services       Precautions / Restrictions Precautions Precautions: Fall Restrictions Weight Bearing Restrictions: No      Mobility  Bed Mobility Overal bed mobility: Needs Assistance Bed Mobility: Supine to Sit     Supine to sit: Supervision     General bed mobility comments: increased time to perform. No physical assist required  Transfers Overall transfer level: Needs assistance Equipment used: Rolling walker (2 wheeled) Transfers: Sit to/from Stand Sit to Stand: Min assist         General transfer comment: Min assist to power up and maintain stability in standing  Ambulation/Gait Ambulation/Gait assistance: Min assist Ambulation Distance (Feet): 6 Feet Assistive device: 1 person hand held assist Gait Pattern/deviations: Step-to pattern;Decreased step length - left;Decreased dorsiflexion - left;Shuffle Gait velocity: decreased Gait velocity interpretation: <1.8 ft/sec, indicative of risk for recurrent falls General Gait Details: LLE buckling upon ambulation attempt, assist required to stabilize  Stairs            Wheelchair  Mobility    Modified Rankin (Stroke Patients Only)       Balance Overall balance assessment: Needs assistance   Sitting balance-Leahy Scale: Good       Standing balance-Leahy Scale: Fair Standing balance comment: UE support required to maintain static standing and dynamic activities   Single Leg Stance - Left Leg:  (unable to maintain without buckling)                         Pertinent Vitals/Pain Pain Assessment: No/denies pain    Home Living Family/patient expects to be discharged to:: Private residence Living Arrangements: Spouse/significant other Available Help at Discharge: Family;Available 24 hours/day Type of Home: House Home Access: Stairs to enter   [employer] of Steps: 3 Home Layout: Two level;Able to live on main level with bedroom/bathroom Home Equipment: Cane - single point      Prior Function Level of Independence: Independent               Hand Dominance   Dominant Hand: Right    Extremity/Trunk Assessment   Upper Extremity Assessment: Overall WFL for tasks assessed           Lower Extremity Assessment: LLE deficits/detail   LLE Deficits / Details: 4/5 strength with modest coordination deficits noted   Cervical / Trunk Assessment:  (incision on low back region)  Communication   Communication: No difficulties  Cognition Arousal/Alertness: Awake/alert Behavior During Therapy: WFL for tasks assessed/performed Overall Cognitive Status: Within Functional Limits for tasks assessed                      General Comments General comments (skin integrity, edema,  etc.): incision on low back    Exercises        Assessment/Plan    PT Assessment Patient needs continued PT services  PT Diagnosis Difficulty walking;Abnormality of gait;Generalized weakness   PT Problem List Decreased strength;Decreased activity tolerance;Decreased balance;Decreased mobility;Decreased coordination;Pain  PT Treatment  Interventions DME instruction;Gait training;Stair training;Functional mobility training;Therapeutic activities;Therapeutic exercise;Balance training;Patient/family education   PT Goals (Current goals can be found in the Care Plan section) Acute Rehab PT Goals Patient Stated Goal: to go home with wife PT Goal Formulation: With patient Time For Goal Achievement: 03/07/15 Potential to Achieve Goals: Good    Frequency Min 3X/week   Barriers to discharge        Co-evaluation               End of Session Equipment Utilized During Treatment: Gait belt Activity Tolerance: Patient tolerated treatment well;Patient limited by fatigue Patient left: in chair;with call bell/phone within reach Nurse Communication: Mobility status         Time: 2440-1027 PT Time Calculation (min) (ACUTE ONLY): 20 min   Charges:   PT Evaluation $Initial PT Evaluation Tier I: 1 Procedure     PT G CodesDuncan Dull 2015-03-06, 5:23 PM  Alben Deeds, [place] DPT  [phone]

## 2015-02-22 NOTE — Progress Notes (Signed)
Physical Therapy Treatment Patient Details Name: Shneur [place] MRN: [id number] DOB: [birth year] Today's Date: 02/22/2015    History of Present Illness 64 y.o. male transferred to [facility] after presenting to [facility] with wound drainage. He underwent right L3-4 microdiscectomy at the outpatient surgical center and now presents s/p CSF leak    PT Comments    Progressing well.  Mobilized with RW well with no knee buckling, but still weak gait.  Follow Up Recommendations  Home health PT;Supervision for mobility/OOB     Equipment Recommendations  Rolling walker with 5" wheels    Recommendations for Other Services       Precautions / Restrictions Precautions Precautions: Fall    Mobility  Bed Mobility Overal bed mobility: Needs Assistance Bed Mobility: Rolling;Sidelying to Sit Rolling: Supervision Sidelying to sit: Supervision       General bed mobility comments: reinforced log roll  Transfers Overall transfer level: Needs assistance Equipment used: Rolling walker (2 wheeled) Transfers: Sit to/from Stand Sit to Stand: Min guard         General transfer comment: assisted initially, then guarded for safety only  Ambulation/Gait Ambulation/Gait assistance: Supervision Ambulation Distance (Feet): 240 Feet Assistive device: Rolling walker (2 wheeled) Gait Pattern/deviations: Step-through pattern     General Gait Details: Both LE tremulous at times, but no buckling.  mildly unsteady, but generally safe with the RW   Stairs            Wheelchair Mobility    Modified Rankin (Stroke Patients Only)       Balance Overall balance assessment: No apparent balance deficits (not formally assessed)   Sitting balance-Leahy Scale: Good       Standing balance-Leahy Scale: Fair                      Cognition Arousal/Alertness: Awake/alert Behavior During Therapy: WFL for tasks assessed/performed Overall Cognitive Status: Within Functional Limits for tasks  assessed                      Exercises      General Comments General comments (skin integrity, edema, etc.): Advised log roll, lifting restrictions and trypical progression of mobility      Pertinent Vitals/Pain Pain Assessment: No/denies pain    Home Living                      Prior Function            PT Goals (current goals can now be found in the care plan section) Acute Rehab PT Goals Patient Stated Goal: to go home with wife PT Goal Formulation: With patient Time For Goal Achievement: 03/07/15 Potential to Achieve Goals: Good Progress towards PT goals: Progressing toward goals    Frequency  Min 3X/week    PT Plan Current plan remains appropriate    Co-evaluation             End of Session   Activity Tolerance: Patient tolerated treatment well;Patient limited by fatigue Patient left: in chair;with call bell/phone within reach     Time: 1520-1541 PT Time Calculation (min) (ACUTE ONLY): 21 min  Charges:  $Gait Training: 8-22 mins                    G Codes:      Aliah Eriksson, Tessie Fass 02/22/2015, 6:04 PM 02/22/2015  Donnella Sham, PT [phone]  (pager)

## 2015-02-22 NOTE — Progress Notes (Signed)
Pt received at 12:32 pm alert with no noted distress. Pt stable, neuro intact. Surgical site clean and dry. Noted staples and sutures intact. Pt states that he has mild pain but not enough for pain medication at this time. Able to follow commands. Pt oriented to room. Safety measures in place. Call bell within reach. Will continue to monitor.

## 2015-02-22 NOTE — Plan of Care (Signed)
Problem: Consults Goal: Diagnosis - Spinal Surgery Outcome: Completed/Met Date Met:  02/22/15 Microdiscectomy

## 2015-02-22 NOTE — Progress Notes (Signed)
Patient ID: James Hays, male   DOB: 02-Oct-1950, 64 y.o.   MRN: [id number] Better,  No headache. Wound dry. Continue iv abs, to regular floor

## 2015-02-23 ENCOUNTER — Inpatient Hospital Stay (HOSPITAL_COMMUNITY): Payer: BLUE CROSS/BLUE SHIELD

## 2015-02-23 ENCOUNTER — Telehealth: Payer: Self-pay | Admitting: *Deleted

## 2015-02-23 DIAGNOSIS — R079 Chest pain, unspecified: Secondary | ICD-10-CM | POA: Diagnosis present

## 2015-02-23 DIAGNOSIS — K5909 Other constipation: Secondary | ICD-10-CM | POA: Diagnosis present

## 2015-02-23 DIAGNOSIS — R7881 Bacteremia: Secondary | ICD-10-CM | POA: Diagnosis present

## 2015-02-23 DIAGNOSIS — E785 Hyperlipidemia, unspecified: Secondary | ICD-10-CM | POA: Diagnosis present

## 2015-02-23 LAB — TROPONIN I
Troponin I: <0.03 ng/mL (ref <0.031)
Troponin I: <0.03 ng/mL (ref <0.031)

## 2015-02-23 LAB — TSH: TSH: 1.28 u[IU]/mL (ref 0.350–4.500)

## 2015-02-23 MED ORDER — PANTOPRAZOLE SODIUM 40 MG PO TBEC
40.0000 mg | DELAYED_RELEASE_TABLET | Freq: Two times a day (BID) | ORAL | Status: DC
  Administered 2015-02-23 – 2015-02-24 (×3): 40 mg via ORAL
  Filled 2015-02-23 (×4): qty 1

## 2015-02-23 MED ORDER — POLYETHYLENE GLYCOL 3350 17 G PO PACK
17.0000 g | PACK | Freq: Every day | ORAL | Status: DC
  Administered 2015-02-23 – 2015-02-24 (×2): 17 g via ORAL
  Filled 2015-02-23 (×2): qty 1

## 2015-02-23 MED ORDER — DOCUSATE SODIUM 100 MG PO CAPS
100.0000 mg | ORAL_CAPSULE | Freq: Two times a day (BID) | ORAL | Status: DC
  Administered 2015-02-23 – 2015-02-24 (×3): 100 mg via ORAL
  Filled 2015-02-23 (×3): qty 1

## 2015-02-23 NOTE — Care Management Note (Signed)
Case Management Note  Patient Details  Name: Bryor Rami MRN: [id number] Date of Birth: [birth year]  Subjective/Objective:                    Action/serPlan: Spoke with Miranda with [facility], who stated that patient had been referred to them by the previous case Freight forwarder.  Orders were received for [facility] services for probable discharge home tomorrow.  Pam with [facility] was also notified and is aware of patient's discharge plan. Expected Discharge Date:  02/20/15               Expected Discharge Plan:  [place]  In-House Referral:     Discharge planning Services  CM Consult  Post Acute Care Choice:  Home Health Choice offered to:  Patient  DME Arranged:    DME Agency:     HH Arranged:  RN, PT, Nurse's Aide, IV Antibiotics HH Agency:  [facility]  Status of Service:  Completed, signed off  Medicare Important Message Given:    Date Medicare IM Given:    Medicare IM give by:    Date Additional Medicare IM Given:    Additional Medicare Important Message give by:     If discussed at [place] of Stay Meetings, dates discussed:    Additional Comments:  Rolm Baptise, RN 02/23/2015, 11:50 AM

## 2015-02-23 NOTE — Progress Notes (Signed)
Patient taken to bathroom with walker. Patient sitting on the side of the bed eating his lunch.

## 2015-02-23 NOTE — Progress Notes (Signed)
Nutrition Follow-up  DOCUMENTATION CODES:   Not applicable  INTERVENTION:  Continue Ensure Enlive po BID, each supplement provides 350 kcal and 20 grams of protein Provide snack once daily   NUTRITION DIAGNOSIS:   Inadequate oral intake related to wound healing, poor appetite as evidenced by meal completion < 50%, per patient/family report.  Ongoing  GOAL:   Patient will meet greater than or equal to 90% of their needs  Being met  MONITOR:   PO intake, Labs, Weight trends, I & O's, Skin  REASON FOR ASSESSMENT:   Malnutrition Screening Tool    ASSESSMENT:   Pt presents with an abcess s/p L3-4 microdisectomy. Displays meal completion below 50% during stay. h/o HLD & multiple surgeries.  Pt reports ongoing poor appetite, mostly due to disliking the food. He reports eating 3 small meals daily but, he continues to drink Ensure Enlive twice daily. He usually eats 6 small meals daily and has protein shakes and protein bars at home- he is looking forward to returning to his normal routine.  RD emphasized the importance of nutrition on healing and recovery. Encouraged protein intake and offered snacks; pt agreeable to continuing Ensure BID and one daily snack.  Labs reviewed.   Diet Order:  Diet regular Room service appropriate?: Yes; Fluid consistency:: Thin  Skin:  Wound (see comment) (closed back incision)  Last BM:  8/24  Height:   Ht Readings from Last 1 Encounters:  02/11/15 $RemoveB'5\' 8"'BjkmENzZ$  (1.727 m)    Weight:   Wt Readings from Last 1 Encounters:  02/11/15 170 lb 10.2 oz (77.4 kg)    Ideal Body Weight:  70 kg  BMI:  Body mass index is 25.95 kg/(m^2).  Estimated Nutritional Needs:   Kcal:  1700-2000  Protein:  90-100 grams/day  Fluid:  >=/ 1.7L  EDUCATION NEEDS:   No education needs identified at this time  Scarlette Ar RD, LDN Inpatient Clinical Dietitian Pager: [phone] After Hours Pager: [phone]

## 2015-02-23 NOTE — Progress Notes (Signed)
Patient lying in bed, no distress

## 2015-02-23 NOTE — Progress Notes (Signed)
Patient lying in bed watching tv, states that the pain has mellowed some, but is still around a 3

## 2015-02-23 NOTE — Progress Notes (Signed)
Patient lying in bed sleeping. States that he is ok, no distress or pain

## 2015-02-23 NOTE — Progress Notes (Signed)
Patient lying in bed, pain level is 3, requested Tylenol

## 2015-02-23 NOTE — Consult Note (Signed)
[facility] Consultation Note                                                                                    James Hays, is a 64 y.o. male  MRN: [id number]   DOB - 1950-11-26  Admit Date - 02/10/2015  Outpatient Primary MD for the patient is James Fraction, MD  Requesting MD: [name] / Neurosurgery  Reason for consultation: Chest Pain  With History of -  Past Medical History  Diagnosis Date  . Hyperlipidemia       Past Surgical History  Procedure Laterality Date  . Vasectomy    . Fracture surgery      L foot  . Hydrocele excision    . Hernia repair      inguinal and umbilical  . Back surgery    . Placement of lumbar drain N/A 02/11/2015    Procedure: PLACEMENT OF LUMBAR DRAIN;  Surgeon: [name], MD;  Location: [facility];  Service: Neurosurgery;  Laterality: N/A;  . Lumbar wound debridement N/A 02/14/2015    Procedure: EXPLORATION LUMBAR WOUND, REPAIR OF CSF LEAK;  Surgeon: [name], MD;  Location: [facility];  Service: Neurosurgery;  Laterality: N/A;    in for   Chief Complaint  Patient presents with  . Post-op Problem     HPI This is a 64 year old male patient who initially underwent outpatient L3-4 microdiscectomy by Dr. [name] and was needed to [facility] Hays on 8/13 because of wound drainage. He was found to have an epidural abscess. On 8/13 he underwent placement of lumbar drain by Dr Kathyrn Sheriff. He was transferred to [facility]ays where he subsequently underwent exploration with I/D of epidural abscess by Dr. [name]. Blood cultures were positive for MSSA and ID was consulted and patient remains on appropriate antibiotics through PICC line. Patient has been doing well and primarily experiencing expected postoperative back pain. The morning of 8/25 patient reported to Dr. [name] chest discomfort thus prompting internal medicine consultation.  In discussion with the patient he has not been having any chest pain and  primarily reports back pain that has been radiating across his lower rib cage. He has not had any chest pain prior to his surgery. Since the surgery he has not had any type of chest pressure, nausea, shortness of breath, diaphoresis or exertional symptoms. Postoperatively he reports symptoms of severe reflux with occasional emesis as well as poor appetite that he relates to the medications he's been receiving especially the pain medications. Because of this he has opted to decrease his pain medications to Tylenol alone. In review as his outpatient documentation patient was taking ibuprofen for pain as well has been prescribed prednisone.   Review of Systems   In addition to the HPI above,  No Fever-chills, myalgias or other constitutional symptoms No Headache, changes with Vision or hearing, new weakness, tingling, numbness in any extremity, No problems swallowing food or Liquids, indigestion/reflux No definitive Chest pain; no Cough or Shortness of Breath, palpitations, orthopnea or DOE No Abdominal pain, N/V; no melena or hematochezia, no dark tarry stools, Bowel movements are regular and at times once weekly with most  recent bowel movement 2 days prior No dysuria, hematuria or flank pain No new skin rashes, lesions, masses or bruises, No new joints pains-aches No recent weight gain or loss No polyuria, polydypsia or polyphagia,  *A full 10 point Review of Systems was done, except as stated above, all other Review of Systems were negative.  Social History Social History  Substance Use Topics  . Smoking status: Current Every Day Smoker -- 1.00 packs/day    Types: Cigarettes  . Smokeless tobacco: Never Used  . Alcohol Use: No    Resides at: Private residence  Lives with: Spouse  Ambulatory status: With rolling walker since surgery with recommendations for home health PT at time of discharge   Family History Family History  Problem Relation Age of Onset  . Hypertension Mother   .  Diabetes Mother   . Heart disease Father 59  . Hyperlipidemia Father   . Diabetes Brother      Prior to Admission medications   Medication Sig Start Date End Date Taking? Authorizing Provider  cholecalciferol (VITAMIN D) 1000 UNITS tablet Take 1,000 Units by mouth daily.   Yes Historical Provider, MD  diazepam (VALIUM) 5 MG tablet Take 5-10 mg by mouth every 6 (six) hours as needed. 02/01/15  Yes Historical Provider, MD  oxyCODONE-acetaminophen (ROXICET) 5-325 MG per tablet Take 2 tablets by mouth every 4 (four) hours as needed for severe pain. 01/23/15  Yes [name], MD  tadalafil (CIALIS) 10 MG tablet Take 10 mg by mouth daily as needed for erectile dysfunction.   Yes Historical Provider, MD  aspirin 81 MG tablet Take 81 mg by mouth daily.    Historical Provider, MD  predniSONE (DELTASONE) 50 MG tablet 1 tablet daily for 7 days Patient not taking: Reported on 02/10/2015 01/17/15   [name], MD    No Known Allergies  Physical Exam  Vitals  Blood pressure 126/64, pulse 68, temperature 98.6 F (37 C), temperature source Oral, resp. rate 20, height 5\' 8"  (1.727 m), weight 170 lb 10.2 oz (77.4 kg), SpO2 92 %.   General:  In no acute distress, appears healthy and well nourished  Psych:  Normal affect, Denies Suicidal or Homicidal ideations, Awake Alert, Oriented X 3. Speech and thought patterns are clear and appropriate, no apparent short term memory deficits  Neuro:   No focal neurological deficits, CN II through XII intact, Strength 5/5 all 4 extremities, Sensation intact all 4 extremities.  ENT:  Ears and Eyes appear Normal, Conjunctivae clear, PER. Moist oral mucosa without erythema or exudates.  Neck:  Supple, No lymphadenopathy appreciated  Respiratory:  Symmetrical chest wall movement, Good air movement bilaterally, CTAB. Room Air  Cardiac:  RRR, No Murmurs, no LE edema noted, no JVD, No carotid bruits, peripheral pulses palpable at 2+  Abdomen:  Positive bowel  sounds, Soft, Non tender, Non distended,  No masses appreciated, no obvious hepatosplenomegaly  Skin:  No Cyanosis, Normal Skin Turgor, No Skin Rash or Bruise.  Extremities: Symmetrical without obvious trauma or injury,  no effusions.  Data Review  CBC No results for input(s): WBC, HGB, HCT, PLT, MCV, MCH, MCHC, RDW, LYMPHSABS, MONOABS, EOSABS, BASOSABS, BANDABS in the last 168 hours.  Invalid input(s): NEUTRABS, BANDSABD  Chemistries  No results for input(s): NA, K, CL, CO2, GLUCOSE, BUN, CREATININE, CALCIUM, MG, AST, ALT, ALKPHOS, BILITOT in the last 168 hours.  Invalid input(s): GFRCGP  estimated creatinine clearance is 70.6 mL/min (by C-G formula based on Cr of 1.05).  No results for input(s): TSH, T4TOTAL, T3FREE, THYROIDAB in the last 72 hours.  Invalid input(s): FREET3  Coagulation profile No results for input(s): INR, PROTIME in the last 168 hours.  No results for input(s): DDIMER in the last 72 hours.  Cardiac Enzymes No results for input(s): CKMB, TROPONINI, MYOGLOBIN in the last 168 hours.  Invalid input(s): CK  Invalid input(s): POCBNP  Urinalysis No results found for: COLORURINE, APPEARANCEUR, LABSPEC, PHURINE, GLUCOSEU, HGBUR, BILIRUBINUR, KETONESUR, PROTEINUR, UROBILINOGEN, NITRITE, LEUKOCYTESUR  Imaging results:   Dg Lumbar Spine 2-3 Views  02/14/2015   CLINICAL DATA:  Lumbar spine surgery. Request localization. Initial encounter.  EXAM: LUMBAR SPINE - 2-3 VIEW  COMPARISON:  MRI of the lumbar spine performed 02/13/2015, and lumbar spine radiograph performed 02/11/2015  FINDINGS: The patient's catheter is noted entering at L4-L5, with its radiopaque tip noted at T10-T11.  Mild disc space narrowing is noted at L4-L5. Minimal anterior osteophytes are seen along the lumbar spine. Visualized neural foramina are grossly unremarkable. The visualized bowel gas pattern is grossly unremarkable in appearance.  IMPRESSION: Catheter noted entering at L4-L5, with its  radiopaque tip seen at T10-T11.  These results were called by telephone at the time of interpretation on 02/14/2015 at 8:47 pm to Dr. [name], who verbally acknowledged these results.   Electronically Signed   By: Garald Balding M.D.   On: 02/14/2015 20:47   Mr Lumbar Spine Wo Contrast  01/25/2015   CLINICAL DATA:  Low back pain and right leg pain.  EXAM: MRI LUMBAR SPINE WITHOUT CONTRAST  TECHNIQUE: Multiplanar, multisequence MR imaging of the lumbar spine was performed. No intravenous contrast was administered.  COMPARISON:  None.  FINDINGS: Normal conus tip at L1-2. Normal paraspinal soft tissues. T11-12 through L2-3: No significant abnormality. Tiny broad-based bulge of the L2-3 disc.  L3-4: Focal soft disc extrusion measuring 16 x 10 x 6 mm extending superiorly behind the body of L3 in the right lateral recess. This compresses the right L3 nerve at the origin of the neural foramen. There is also a small broad-based soft disc protrusion extending across the midline and extending into the left neural foramen with an annular fissure immediately adjacent to the exiting left L3 nerve.  L4-5: Benign the 17 mm hemangioma in the left side of L4. Chronic disc space narrowing with broad-based disc bulge with accompanying osteophytes extend into both neural foramina without neural impingement. Minimal degenerative changes of the facet joints.  L5-S1: Normal disc. Minimal degenerative changes of the facet joints.  IMPRESSION: 1. Prominent soft disc extrusion at L3-4 extending superiorly behind the body of L3 on the right compressing the right L3 nerve. 2. Chronic degenerative disc disease at L4-5 without neural impingement.   Electronically Signed   By: Lorriane Shire M.D.   On: 01/25/2015 11:21   Mr Lumbar Spine W Wo Contrast  02/13/2015   CLINICAL DATA:  Status post L3-4 micro diskectomy 10 days ago. Drainage from surgical wound beginning 2 days ago. Postural severe headache, with diagnosed postoperative CSF  leak. Blood cultures positive for staph aureus. Febrile. Assess bacteremia.  EXAM: MRI LUMBAR SPINE WITHOUT AND WITH CONTRAST  TECHNIQUE: Multiplanar and multiecho pulse sequences of the lumbar spine were obtained without and with intravenous contrast.  CONTRAST:  33mL MULTIHANCE GADOBENATE DIMEGLUMINE 529 MG/ML IV SOLN  COMPARISON:  MRI of the lumbar spine January 25, 2015  FINDINGS: Using previously described reference levels, interval RIGHT L3 hemilaminectomy. Focal low T1, bright T2 sub cm fluid collection within  the bony defect, contiguous with the surgical approach as seen on axial 19/40, faint rim enhancement. 4.3 x 0.8 x 0.8 mm bright T2, low T1 rim enhancing ventral epidural fluid collection from mid L3 to L4-5 disc space. Fluid collection is contiguous with the ventral L3-4 disc, status post L3-4 micro discectomy.  Moderate to severe L4-5 disc height loss is unchanged, however there is not bright STIR signal within this disc consistent with edema, moderate acute on chronic discogenic endplate changes without suspicious intradiscal enhancement at this level. Mild enhancing discogenic endplate changes T0-2. Scattered chronic Schmorl's nodes.  Conus medullaris terminates at L1 is normal morphology and signal characteristics. No nerve root clumping or definite leptomeningeal enhancement though, moderately motion degraded axial post gadolinium sequence.  Level by level evaluation:  L1-2 and L2-3: No disc bulge, canal stenosis nor neural foraminal narrowing.  L3-4: Status post RIGHT hemilaminectomy. Ventral epidural fluid collection effaces the RIGHT lateral recess, posteriorly displacing the traversing RIGHT L4 nerve. Posterior decompression. Moderate RIGHT, mild LEFT neural foraminal narrowing.  L4-5: Small broad-based disc bulge, mild facet arthropathy and ligamentum flavum redundancy without canal stenosis. Moderate RIGHT, mild LEFT neural foraminal narrowing.  L5-S1: No disc bulge. Moderate facet arthropathy  without canal stenosis or neural foraminal narrowing.  IMPRESSION: 4.3 x 0.8 x 0.8 cm ventral epidural fluid collection from mid L3 to L4-5 disc space, compatible with epidural abscess, less likely hematoma. Fluid collection communicates within the dorsal L3-4 disc concerning for a superimposed discitis. Status post RIGHT L3 hemilaminectomy. Subcentimeter fluid collection within the surgical bed could represent seroma or even small pseudomeningocele, less likely abscess.  New edema within the L4-5 disc, this favors reactive changes, less likely discitis.  No canal stenosis. Moderate RIGHT L3-4 and L4-5 neural foraminal narrowing, mild on the LEFT at these levels.   Electronically Signed   By: Elon Alas M.D.   On: 02/13/2015 23:45   Dg Lumbar Spine 1 View  02/11/2015   CLINICAL DATA:  64 year old male undergoing lumbar drain placement. Initial encounter.  EXAM: DG C-ARM 61-120 MIN; LUMBAR SPINE - 1 VIEW  COMPARISON:  Intraoperative lumbar images 8 /08/2014 [facility], and earlier.  FINDINGS: Single fluoroscopic AP view of the lower lumbar spine. Spinal needle projects at what appears to be the L5-S1 level, left sub laminar position.  FLUOROSCOPY TIME:  0 min 11 seconds  IMPRESSION: Spinal needle projecting over the lower lumbar spine.   Electronically Signed   By: Genevie Ann M.D.   On: 02/11/2015 20:06   Dg Chest Port 1 View  02/23/2015   CLINICAL DATA:  Chest pain  EXAM: PORTABLE CHEST - 1 VIEW  COMPARISON:  None.  FINDINGS: Cardiomediastinal silhouette is unremarkable. No pneumothorax. Right arm PICC line with tip in SVC right atrium junction. There is streaky mild interstitial prominence bilateral upper lobe. Mild edema or pneumonitis cannot be excluded. Clinical correlation is necessary. No segmental infiltrate.  IMPRESSION: Streaky mild interstitial prominence bilateral upper lobe. Mild edema or pneumonitis cannot be excluded. No segmental infiltrate.   Electronically  Signed   By: Lahoma Crocker M.D.   On: 02/23/2015 10:27   Dg C-arm 1-60 Min  02/11/2015   CLINICAL DATA:  64 year old male undergoing lumbar drain placement. Initial encounter.  EXAM: DG C-ARM 61-120 MIN; LUMBAR SPINE - 1 VIEW  COMPARISON:  Intraoperative lumbar images 8 /08/2014 [facility], and earlier.  FINDINGS: Single fluoroscopic AP view of the lower lumbar spine. Spinal needle projects  at what appears to be the L5-S1 level, left sub laminar position.  FLUOROSCOPY TIME:  0 min 11 seconds  IMPRESSION: Spinal needle projecting over the lower lumbar spine.   Electronically Signed   By: Genevie Ann M.D.   On: 02/11/2015 20:06     EKG: (Independently reviewed) ordered and pending at time of dictation   Assessment & Plan  Principal Problem:   Atypical Chest pain -Based on history obtained from patient current chest pain not consistent with ischemic etiology -Given history of NSAIDs and prednisone prior to admission and patient reports of reflux suspect a degree of GI symptoms contributing -Patient reports symptoms began as back pain radiating towards the lower rib cage so suspect musculoskeletal primary etiology -Dr. [name] has recommended no further NSAIDs at this juncture due to risk of bleeding and we agree -Patient does have underlying dyslipidemia, he is male and age greater than 70, and he is a current active smoker therefore's precaution we'll check EKG, cycle troponins. -Not hypoxemic or tachycardic so doubt PE; no fevers or white count so doubt pneumonia but as precaution we'll check underlying chest x-ray to rule out atelectasis -Patient underwent echocardiogram as part of his evaluation for his bacteremia and this shows normal LV systolic function and no regional wall motion abnormalities so this makes likelihood of ischemic etiology to current symptoms less likely  Active Problems:   HLD -Was not all medications prior to admission -Check lipid panel as part of  cardiac ischemic workup    Other constipation -Patient reports personal and familial history constipation typical bowel movements up to 1 week apart -Last BM was 2 days ago -Begin scheduled Colace and MiraLAX -Check TSH    S/P diskectomy/  Abscess in epidural space of lumbar spine/Staphylococcus aureus bacteremia -Per primary team/Dr. [name]     DVT Prophylaxis: Subcutaneous heparin  Family Communication:   No family at bedside  Code Status:  Full code  Condition:  Stable  Discharge disposition: At discretion of primary team  Time spent in minutes : 60      ELLIS,ALLISON L. ANP on 02/23/2015 at 10:35 AM  Between 7am to 7pm - Pager - [phone]  After 7pm go to [email or web address] - password [email or web address]  And look for the night coverage person covering me after hours  [facility]

## 2015-02-23 NOTE — Progress Notes (Signed)
Patient ID: James Hays, male   DOB: Feb 01, 1951, 64 y.o.   MRN: [id number] Doing well, ambulating, no fever. Dc in am

## 2015-02-23 NOTE — Telephone Encounter (Signed)
Received vm from wife stating that pt is in hospital and should be released by Saturday and states needs a release from [place]  Stating that pt is needing [place]   Please call Brayten Komar (wife) at [phone]

## 2015-02-23 NOTE — Progress Notes (Signed)
Patient lying in bed speaking with his wife. Inquired about medications given.  Advised patient, no distress or pain.

## 2015-02-23 NOTE — Progress Notes (Signed)
Physical Therapy Treatment Patient Details Name: James Hays MRN: [id number] DOB: 05-Jan-1951 Today's Date: 02/23/2015    History of Present Illness 64 y.o. male transferred to [facility] after presenting to [facility] with wound drainage. He underwent right L3-4 microdiscectomy at the outpatient surgical center and now presents s/p CSF leak    PT Comments    Patient progressing with activity tolerance. Increased ambulation distance. Patient still with heavy reliance on RW. Attempted ambulation without RW. Patient unsteady, requires RW for safety. Discussed mobility expectations for discharge. HIGHLY recommend use of RW initially upon discharge secondary to patient increased fall risk and LE weakness.   Follow Up Recommendations  Home health PT;Supervision for mobility/OOB     Equipment Recommendations  Rolling walker with 5" wheels    Recommendations for Other Services       Precautions / Restrictions Precautions Precautions: Fall Restrictions Weight Bearing Restrictions: No    Mobility  Bed Mobility Overal bed mobility: Needs Assistance Bed Mobility: Rolling;Sidelying to Sit;Sit to Sidelying Rolling: Modified independent (Device/Increase time) Sidelying to sit: Modified independent (Device/Increase time)     Sit to sidelying: Modified independent (Device/Increase time) General bed mobility comments: observed for compliance with comfort precautions, increased time, no physical assist required  Transfers Overall transfer level: Needs assistance Equipment used: Rolling walker (2 wheeled);1 person hand held assist Transfers: Sit to/from Stand Sit to Stand: Supervision         General transfer comment: Vcs for hand placement  Ambulation/Gait Ambulation/Gait assistance: Supervision (min assist with instability when not using RW) Ambulation Distance (Feet): 380 Feet Assistive device: Rolling walker (2 wheeled) (80 ft without Rw) Gait Pattern/deviations: Step-through  pattern Gait velocity: decreased Gait velocity interpretation: Below normal speed for age/gender General Gait Details: Improved LE strength, able to tolerate increased distance as well as ambulate without RW for 80 ft with assist. VCs for increased cadence and upright posture   Stairs            Wheelchair Mobility    Modified Rankin (Stroke Patients Only)       Balance     Sitting balance-Leahy Scale: Good       Standing balance-Leahy Scale: Fair                      Cognition Arousal/Alertness: Awake/alert Behavior During Therapy: WFL for tasks assessed/performed Overall Cognitive Status: Within Functional Limits for tasks assessed                      Exercises      General Comments General comments (skin integrity, edema, etc.): reinforced education for positioning QA:[id number]. Also discussed mobility expectations for discharge home. Recommended use of RW initially      Pertinent Vitals/Pain Pain Assessment: No/denies pain    Home Living                      Prior Function            PT Goals (current goals can now be found in the care plan section) Acute Rehab PT Goals Patient Stated Goal: to go home with wife PT Goal Formulation: With patient Time For Goal Achievement: 03/07/15 Potential to Achieve Goals: Good Progress towards PT goals: Progressing toward goals    Frequency  Min 3X/week    PT Plan Current plan remains appropriate    Co-evaluation             End of Session Equipment Utilized During  Treatment: Gait belt Activity Tolerance: Patient tolerated treatment well;Patient limited by fatigue Patient left: in bed;with call bell/phone within reach;with bed alarm set     Time: 6579-0383 PT Time Calculation (min) (ACUTE ONLY): 19 min  Charges:  $Gait Training: 8-22 mins                    G CodesDuncan Dull 21-Mar-2015, 2:55 PM Alben Deeds, [place] DPT  [phone]

## 2015-02-24 ENCOUNTER — Telehealth: Payer: Self-pay | Admitting: Family Medicine

## 2015-02-24 DIAGNOSIS — M5386 Other specified dorsopathies, lumbar region: Secondary | ICD-10-CM

## 2015-02-24 LAB — LIPID PANEL
CHOL/HDL RATIO: 7.2 ratio
CHOLESTEROL: 209 mg/dL — AB (ref 0–200)
HDL: 29 mg/dL — AB (ref >40)
LDL Cholesterol: 141 mg/dL — ABNORMAL HIGH (ref 0–99)
Triglycerides: 197 mg/dL — ABNORMAL HIGH (ref <150)
VLDL: 39 mg/dL (ref 0–40)

## 2015-02-24 MED ORDER — CYCLOBENZAPRINE HCL 10 MG PO TABS
10.0000 mg | ORAL_TABLET | Freq: Three times a day (TID) | ORAL | Status: DC | PRN
Start: 2015-02-24 — End: 2015-05-09

## 2015-02-24 MED ORDER — OXYCODONE-ACETAMINOPHEN 5-325 MG PO TABS: 1.0000 | ORAL_TABLET | ORAL | Status: DC | PRN

## 2015-02-24 NOTE — Telephone Encounter (Signed)
Due to late nature and pt needing pain med - pt did not want to leave it outside of the building as she is scared she would get in trouble as the [facility]h nurse if at their home now and it would be about 1-2 hours before she could get here  - I will drop rx off at the [facility] in [place] on my way home and pt can pick up at his convenience

## 2015-02-24 NOTE — Telephone Encounter (Signed)
Called and spoke to pt's wife 02/23/15 and informed that the hospital will set up [facility] for pt once dc'd from hospital.

## 2015-02-24 NOTE — Telephone Encounter (Signed)
?   OK to Refill  

## 2015-02-24 NOTE — Progress Notes (Signed)
Patient is discharged from room 5C19 at this time. Alert and in stable condition. IV site d/c'd. [facility] nurse did bedside teaching on abt with PICC to RUA. Instructions read to patient and understanding verbalized. Verify via phone with Dr. [name] about prescriptions. Left unit via wheelchair with wife and all belongings at side.

## 2015-02-24 NOTE — Telephone Encounter (Signed)
ok 

## 2015-02-24 NOTE — Telephone Encounter (Signed)
Patients wife stopped by the office requesting a refill on his percocet and flexerial. Please call her when ready for pick up. She states that he only has 2 percocet's left.

## 2015-02-24 NOTE — Discharge Summary (Signed)
Physician Discharge Summary  Patient ID: James Hays MRN: [id number] DOB/AGE: August 06, 1950 64 y.o.  Admit date: 02/10/2015 Discharge date: 02/24/2015  Admission Diagnoses staph bacteremia, lumbar epidural abscess, csf leak  Discharge Diagnoses:  Bacteremia secondary to staph Csf leak Epidural abscess  Discharged Condition: no pain, no fever  Hospital Course: surgery  Consults: ID  Significant Diagnostic Studies: MRI LUMBAR, BLOOD CULTURES  Treatments: lumbar drain, exploration of lumbar wound with ID of abscess  Discharge Exam: Blood pressure 137/72, pulse 51, temperature 98.5 F (36.9 C), temperature source Oral, resp. rate 20, height 5\' 8"  (1.727 m), weight 77.4 kg (170 lb 10.2 oz), SpO2 92 %. No headadche, no fever. Wound dry  Disposition: home. IV ABS for 4 more weeks. To have stiches removed next Thursday or Friday.     Medication List    ASK your doctor about these medications        aspirin 81 MG tablet  Take 81 mg by mouth daily.     cholecalciferol 1000 UNITS tablet  Commonly known as:  VITAMIN D  Take 1,000 Units by mouth daily.     diazepam 5 MG tablet  Commonly known as:  VALIUM  Take 5-10 mg by mouth every 6 (six) hours as needed.     oxyCODONE-acetaminophen 5-325 MG per tablet  Commonly known as:  ROXICET  Take 2 tablets by mouth every 4 (four) hours as needed for severe pain.     predniSONE 50 MG tablet  Commonly known as:  DELTASONE  1 tablet daily for 7 days     tadalafil 10 MG tablet  Commonly known as:  CIALIS  Take 10 mg by mouth daily as needed for erectile dysfunction.         Signed: Floyce Stakes 02/24/2015, 9:28 AM

## 2015-03-01 ENCOUNTER — Telehealth: Payer: Self-pay | Admitting: Family Medicine

## 2015-03-01 NOTE — Telephone Encounter (Signed)
Reviewed Discharge Summary for 02/24/2015. This provides little information.  Reviewed patient's medications. He is on no medications that would affect potassium. No diuretic. 02/10/15 potassium was 4.0. Tell patient to eat a banana or drink some orange juice and this will bring potassium up to normal now. Reviewed prior sedimentation rates. 02/10/15 it was 95. 02/13/15 it was 88. Now has decreased further and trending down.

## 2015-03-01 NOTE — Telephone Encounter (Signed)
Home Health called and stated that they drew labs on Monday and pt's potassium was 3.3, his sed rate was 39 and his CPK was 6.5. Please advise

## 2015-03-01 NOTE — Telephone Encounter (Signed)
Pt aware of the below and Sarah from home health aware as well

## 2015-03-06 ENCOUNTER — Other Ambulatory Visit (HOSPITAL_COMMUNITY)
Admission: RE | Admit: 2015-03-06 | Discharge: 2015-03-06 | Disposition: A | Discharge status: Home / Self Care | Payer: BLUE CROSS/BLUE SHIELD | Source: Other Acute Inpatient Hospital | Attending: Neurosurgery | Admitting: Neurosurgery

## 2015-03-06 DIAGNOSIS — G061 Intraspinal abscess and granuloma: Secondary | ICD-10-CM | POA: Insufficient documentation

## 2015-03-06 DIAGNOSIS — B9561 Methicillin susceptible Staphylococcus aureus infection as the cause of diseases classified elsewhere: Secondary | ICD-10-CM | POA: Diagnosis not present

## 2015-03-06 DIAGNOSIS — Z5181 Encounter for therapeutic drug level monitoring: Secondary | ICD-10-CM | POA: Diagnosis not present

## 2015-03-06 LAB — SEDIMENTATION RATE: SED RATE: 95 mm/h — AB (ref 0–16)

## 2015-03-06 LAB — CBC WITH DIFFERENTIAL/PLATELET
BASOS ABS: 0.1 10*3/uL (ref 0.0–0.1)
BASOS PCT: 1 % (ref 0–1)
Eosinophils Absolute: 0.3 10*3/uL (ref 0.0–0.7)
Eosinophils Relative: 5 % (ref 0–5)
HEMATOCRIT: 40.2 % (ref 39.0–52.0)
Hemoglobin: 13.1 g/dL (ref 13.0–17.0)
LYMPHS PCT: 25 % (ref 12–46)
Lymphs Abs: 1.6 10*3/uL (ref 0.7–4.0)
MCH: 31.2 pg (ref 26.0–34.0)
MCHC: 32.6 g/dL (ref 30.0–36.0)
MCV: 95.7 fL (ref 78.0–100.0)
MONO ABS: 0.5 10*3/uL (ref 0.1–1.0)
Monocytes Relative: 7 % (ref 3–12)
NEUTROS ABS: 4 10*3/uL (ref 1.7–7.7)
NEUTROS PCT: 62 % (ref 43–77)
Platelets: 347 10*3/uL (ref 150–400)
RBC: 4.2 MIL/uL — AB (ref 4.22–5.81)
RDW: 13.5 % (ref 11.5–15.5)
WBC: 6.5 10*3/uL (ref 4.0–10.5)

## 2015-03-06 LAB — BASIC METABOLIC PANEL
ANION GAP: 5 (ref 5–15)
BUN: 11 mg/dL (ref 6–20)
CALCIUM: 11.3 mg/dL — AB (ref 8.9–10.3)
CO2: 28 mmol/L (ref 22–32)
Chloride: 103 mmol/L (ref 101–111)
Creatinine, Ser: 0.84 mg/dL (ref 0.61–1.24)
GLUCOSE: 94 mg/dL (ref 65–99)
POTASSIUM: 3.8 mmol/L (ref 3.5–5.1)
SODIUM: 136 mmol/L (ref 135–145)

## 2015-03-06 LAB — C-REACTIVE PROTEIN: CRP: 0.7 mg/dL (ref <1.0)

## 2015-03-09 ENCOUNTER — Telehealth: Payer: Self-pay | Admitting: Family Medicine

## 2015-03-09 DIAGNOSIS — M5386 Other specified dorsopathies, lumbar region: Secondary | ICD-10-CM

## 2015-03-09 MED ORDER — OXYCODONE-ACETAMINOPHEN 5-325 MG PO TABS: 1.0000 | ORAL_TABLET | ORAL | Status: DC | PRN

## 2015-03-09 NOTE — Telephone Encounter (Signed)
RX printed, left up front and patient aware to pick up  

## 2015-03-09 NOTE — Telephone Encounter (Signed)
ok 

## 2015-03-09 NOTE — Telephone Encounter (Signed)
?   OK to Refill  

## 2015-03-09 NOTE — Telephone Encounter (Signed)
oxyCODONE-acetaminophen (ROXICET) 5-325 calling for a prescription refill

## 2015-03-13 LAB — FUNGUS CULTURE W SMEAR: FUNGAL SMEAR: NONE SEEN

## 2015-03-14 ENCOUNTER — Emergency Department (HOSPITAL_COMMUNITY)
Admission: EM | Admit: 2015-03-14 | Discharge: 2015-03-14 | Disposition: A | Discharge status: Home / Self Care | Payer: BLUE CROSS/BLUE SHIELD | Attending: Emergency Medicine | Admitting: Emergency Medicine

## 2015-03-14 ENCOUNTER — Emergency Department (HOSPITAL_COMMUNITY): Payer: BLUE CROSS/BLUE SHIELD

## 2015-03-14 ENCOUNTER — Encounter (HOSPITAL_COMMUNITY): Payer: Self-pay | Admitting: Emergency Medicine

## 2015-03-14 DIAGNOSIS — K5900 Constipation, unspecified: Secondary | ICD-10-CM | POA: Insufficient documentation

## 2015-03-14 DIAGNOSIS — Z720 Tobacco use: Secondary | ICD-10-CM | POA: Insufficient documentation

## 2015-03-14 DIAGNOSIS — Z8639 Personal history of other endocrine, nutritional and metabolic disease: Secondary | ICD-10-CM | POA: Diagnosis not present

## 2015-03-14 DIAGNOSIS — R109 Unspecified abdominal pain: Secondary | ICD-10-CM

## 2015-03-14 LAB — COMPREHENSIVE METABOLIC PANEL
ALK PHOS: 56 U/L (ref 38–126)
ALT: 10 U/L — ABNORMAL LOW (ref 17–63)
ANION GAP: 6 (ref 5–15)
AST: 23 U/L (ref 15–41)
Albumin: 4 g/dL (ref 3.5–5.0)
BILIRUBIN TOTAL: 0.8 mg/dL (ref 0.3–1.2)
BUN: 12 mg/dL (ref 6–20)
CALCIUM: 11.4 mg/dL — AB (ref 8.9–10.3)
CO2: 24 mmol/L (ref 22–32)
Chloride: 106 mmol/L (ref 101–111)
Creatinine, Ser: 0.91 mg/dL (ref 0.61–1.24)
GLUCOSE: 104 mg/dL — AB (ref 65–99)
Potassium: 4.2 mmol/L (ref 3.5–5.1)
Sodium: 136 mmol/L (ref 135–145)
TOTAL PROTEIN: 7.3 g/dL (ref 6.5–8.1)

## 2015-03-14 LAB — CBC WITH DIFFERENTIAL/PLATELET
Basophils Absolute: 0 10*3/uL (ref 0.0–0.1)
Basophils Relative: 1 % (ref 0–1)
Eosinophils Absolute: 0.1 10*3/uL (ref 0.0–0.7)
Eosinophils Relative: 1 % (ref 0–5)
HEMATOCRIT: 43.8 % (ref 39.0–52.0)
HEMOGLOBIN: 14.6 g/dL (ref 13.0–17.0)
LYMPHS ABS: 1.5 10*3/uL (ref 0.7–4.0)
Lymphocytes Relative: 20 % (ref 12–46)
MCH: 31.2 pg (ref 26.0–34.0)
MCHC: 33.3 g/dL (ref 30.0–36.0)
MCV: 93.6 fL (ref 78.0–100.0)
MONOS PCT: 8 % (ref 3–12)
Monocytes Absolute: 0.6 10*3/uL (ref 0.1–1.0)
NEUTROS ABS: 5.3 10*3/uL (ref 1.7–7.7)
NEUTROS PCT: 70 % (ref 43–77)
Platelets: 239 10*3/uL (ref 150–400)
RBC: 4.68 MIL/uL (ref 4.22–5.81)
RDW: 13.1 % (ref 11.5–15.5)
WBC: 7.6 10*3/uL (ref 4.0–10.5)

## 2015-03-14 LAB — URINALYSIS, ROUTINE W REFLEX MICROSCOPIC
BILIRUBIN URINE: NEGATIVE
Glucose, UA: NEGATIVE mg/dL
HGB URINE DIPSTICK: NEGATIVE
Ketones, ur: NEGATIVE mg/dL
Leukocytes, UA: NEGATIVE
Nitrite: NEGATIVE
Protein, ur: NEGATIVE mg/dL
SPECIFIC GRAVITY, URINE: 1.015 (ref 1.005–1.030)
UROBILINOGEN UA: 0.2 mg/dL (ref 0.0–1.0)
pH: 6.5 (ref 5.0–8.0)

## 2015-03-14 LAB — LIPASE, BLOOD: Lipase: 30 U/L (ref 22–51)

## 2015-03-14 MED ORDER — IOHEXOL 300 MG/ML  SOLN
100.0000 mL | Freq: Once | INTRAMUSCULAR | Status: AC | PRN
  Administered 2015-03-14: 100 mL via INTRAVENOUS

## 2015-03-14 MED ORDER — IOHEXOL 300 MG/ML  SOLN
50.0000 mL | Freq: Once | INTRAMUSCULAR | Status: AC | PRN
  Administered 2015-03-14: 50 mL via ORAL

## 2015-03-14 NOTE — Discharge Instructions (Signed)

## 2015-03-14 NOTE — ED Notes (Signed)
Patient with no complaints at this time. Respirations even and unlabored. Skin warm/dry. Discharge instructions reviewed with patient at this time. Patient given opportunity to voice concerns/ask questions. IV removed per policy and band-aid applied to site. Patient discharged at this time and left Emergency Department with steady gait.  

## 2015-03-14 NOTE — ED Notes (Signed)
Having issues with constipation for last 7 days.  Having rectal pain, rates pain 9/10.  Had a very small hard this am.

## 2015-03-14 NOTE — ED Provider Notes (Signed)
CSN: [id number]     Arrival date & time 03/14/15  1141 History   First MD Initiated Contact with Patient 03/14/15 1217     Chief Complaint  Patient presents with  . Constipation     (Consider location/radiation/quality/duration/timing/severity/associated sxs/prior Treatment) HPI Comments: Patient presents to the ER for evaluation of constipation. Patient reports that he has been experiencing rectal pain and decreased bowel movements for approximately a week. Patient was hospitalized recently for epidural abscess. He has been on home antibiotic's through a PICC line. He has been home for 2 weeks. He has only had one normal bowel movement during that time. Patient reports that he has not been eating since he has been home. There is no fever or abdominal pain. Patient reports that he has had to disimpact himself several times, is only passing small pebbles of stool.  Patient is a 64 y.o. male presenting with constipation.  Constipation   Past Medical History  Diagnosis Date  . Hyperlipidemia    Past Surgical History  Procedure Laterality Date  . Vasectomy    . Fracture surgery      L foot  . Hydrocele excision    . Hernia repair      inguinal and umbilical  . Back surgery    . Placement of lumbar drain N/A 02/11/2015    Procedure: PLACEMENT OF LUMBAR DRAIN;  Surgeon: [name], MD;  Location: [place] [facility];  Service: Neurosurgery;  Laterality: N/A;  . Lumbar wound debridement N/A 02/14/2015    Procedure: EXPLORATION LUMBAR WOUND, REPAIR OF CSF LEAK;  Surgeon: [name], MD;  Location: [place] [facility];  Service: Neurosurgery;  Laterality: N/A;   Family History  Problem Relation Age of Onset  . Hypertension Mother   . Diabetes Mother   . Heart disease Father 82  . Hyperlipidemia Father   . Diabetes Brother    Social History  Substance Use Topics  . Smoking status: Current Every Day Smoker -- 1.00 packs/day    Types: Cigarettes  . Smokeless tobacco: Never Used  .  Alcohol Use: No    Review of Systems  Gastrointestinal: Positive for constipation and rectal pain.  All other systems reviewed and are negative.     Allergies  Review of patient's allergies indicates no known allergies.  Home Medications   Prior to Admission medications   Medication Sig Start Date End Date Taking? Authorizing Provider  cyclobenzaprine (FLEXERIL) 10 MG tablet Take 1 tablet (10 mg total) by mouth 3 (three) times daily as needed for muscle spasms. 02/24/15  Yes [name], MD  oxyCODONE-acetaminophen (ROXICET) 5-325 MG per tablet Take 1-2 tablets by mouth every 4 (four) hours as needed for severe pain. 03/09/15  Yes [name], MD  tadalafil (CIALIS) 10 MG tablet Take 10 mg by mouth daily as needed for erectile dysfunction.   Yes Historical Provider, MD  predniSONE (DELTASONE) 50 MG tablet 1 tablet daily for 7 days Patient not taking: Reported on 02/10/2015 01/17/15   [name], MD   BP 122/86 mmHg  Pulse 93  Temp(Src) 98 F (36.7 C) (Oral)  Resp 14  Ht 5\' 8"  (1.727 m)  Wt 153 lb 4.8 oz (69.536 kg)  BMI 23.31 kg/m2  SpO2 96% Physical Exam  Constitutional: He is oriented to person, place, and time. He appears well-developed and well-nourished. No distress.  HENT:  Head: Normocephalic and atraumatic.  Right Ear: Hearing normal.  Left Ear: Hearing normal.  Nose: Nose normal.  Mouth/Throat: Oropharynx  is clear and moist and mucous membranes are normal.  Eyes: Conjunctivae and EOM are normal. Pupils are equal, round, and reactive to light.  Neck: Normal range of motion. Neck supple.  Cardiovascular: Regular rhythm, S1 normal and S2 normal.  Exam reveals no gallop and no friction rub.   No murmur heard. Pulmonary/Chest: Effort normal and breath sounds normal. No respiratory distress. He exhibits no tenderness.  Abdominal: Soft. Normal appearance and bowel sounds are normal. There is no hepatosplenomegaly. There is no tenderness. There is no rebound, no  guarding, no tenderness at McBurney's point and negative Murphy's sign. No hernia.  Genitourinary: Rectum normal and prostate normal. Rectal exam shows no external hemorrhoid, no internal hemorrhoid, no mass and no tenderness.  Musculoskeletal: Normal range of motion.  Neurological: He is alert and oriented to person, place, and time. He has normal strength. No cranial nerve deficit or sensory deficit. Coordination normal. GCS eye subscore is 4. GCS verbal subscore is 5. GCS motor subscore is 6.  Skin: Skin is warm, dry and intact. No rash noted. No cyanosis.  Psychiatric: He has a normal mood and affect. His speech is normal and behavior is normal. Thought content normal.  Nursing note and vitals reviewed.   ED Course  Procedures (including critical care time) Labs Review Labs Reviewed  COMPREHENSIVE METABOLIC PANEL - Abnormal; Notable for the following:    Glucose, Bld 104 (*)    Calcium 11.4 (*)    ALT 10 (*)    All other components within normal limits  CBC WITH DIFFERENTIAL/PLATELET  LIPASE, BLOOD  URINALYSIS, ROUTINE W REFLEX MICROSCOPIC (NOT AT [facility])    Imaging Review Ct Chest W Contrast  03/14/2015   CLINICAL DATA:  Having issues with constipation for last 7 days. Having rectal pain, rates pain 9/10.  EXAM: CT CHEST, ABDOMEN, AND PELVIS WITH CONTRAST  TECHNIQUE: Multidetector CT imaging of the chest, abdomen and pelvis was performed following the standard protocol during bolus administration of intravenous contrast.  CONTRAST:  70mL OMNIPAQUE IOHEXOL 300 MG/ML SOLN, 148mL OMNIPAQUE IOHEXOL 300 MG/ML SOLN  COMPARISON:  None.  FINDINGS: CT CHEST FINDINGS  Mediastinum/Nodes: Thyroid normal. 10 mm precarinal lymph node with smaller AP window lymph nodes present. No hilar adenopathy. No pericardial effusion.  Lungs/Pleura: No pleural effusion. Bilateral upper lobe interstitial opacity and architectural distortion. Incidental note of azygos lobe right upper lobe.  Musculoskeletal: No acute  findings  CT ABDOMEN PELVIS FINDINGS  Hepatobiliary: Multiple hepatic cysts throughout right and left lobe ranging in size from less than a cm to multiple cysts in the range of 2.525 cm. Gallbladder is normal.  Pancreas: Normal  Spleen: Normal  Adrenals/Urinary Tract: Normal adrenal glands. 2 mm nonobstructing upper pole right renal stone. A few scattered bilateral renal cysts, the largest in the left midpole measuring 1 cm. No hydronephrosis. Bladder negative.60mm stone vs artifact in proximal right ureter.  Stomach/Bowel: Normal including appendix  Vascular/Lymphatic: Minimal atherosclerotic calcification. No adenopathy.  Reproductive: Negative  Other: No free fluid  Musculoskeletal: No acute findings. Moderate L4-5 degenerative disc disease.  IMPRESSION: No acute abnormalities or findings to explain patient's symptoms.  Single borderline mediastinal lymph node.  Multiple hepatic and a few scattered renal cysts.  53mm stone within proximal right ureter vs artifact, with no hydronephrosis.   Electronically Signed   By: Skipper Cliche M.D.   On: 03/14/2015 15:22   Ct Abdomen Pelvis W Contrast  03/14/2015   CLINICAL DATA:  Having issues with constipation for last 7  days. Having rectal pain, rates pain 9/10.  EXAM: CT CHEST, ABDOMEN, AND PELVIS WITH CONTRAST  TECHNIQUE: Multidetector CT imaging of the chest, abdomen and pelvis was performed following the standard protocol during bolus administration of intravenous contrast.  CONTRAST:  52mL OMNIPAQUE IOHEXOL 300 MG/ML SOLN, 176mL OMNIPAQUE IOHEXOL 300 MG/ML SOLN  COMPARISON:  None.  FINDINGS: CT CHEST FINDINGS  Mediastinum/Nodes: Thyroid normal. 10 mm precarinal lymph node with smaller AP window lymph nodes present. No hilar adenopathy. No pericardial effusion.  Lungs/Pleura: No pleural effusion. Bilateral upper lobe interstitial opacity and architectural distortion. Incidental note of azygos lobe right upper lobe.  Musculoskeletal: No acute findings  CT ABDOMEN  PELVIS FINDINGS  Hepatobiliary: Multiple hepatic cysts throughout right and left lobe ranging in size from less than a cm to multiple cysts in the range of 2.525 cm. Gallbladder is normal.  Pancreas: Normal  Spleen: Normal  Adrenals/Urinary Tract: Normal adrenal glands. 2 mm nonobstructing upper pole right renal stone. A few scattered bilateral renal cysts, the largest in the left midpole measuring 1 cm. No hydronephrosis. Bladder negative.63mm stone vs artifact in proximal right ureter.  Stomach/Bowel: Normal including appendix  Vascular/Lymphatic: Minimal atherosclerotic calcification. No adenopathy.  Reproductive: Negative  Other: No free fluid  Musculoskeletal: No acute findings. Moderate L4-5 degenerative disc disease.  IMPRESSION: No acute abnormalities or findings to explain patient's symptoms.  Single borderline mediastinal lymph node.  Multiple hepatic and a few scattered renal cysts.  22mm stone within proximal right ureter vs artifact, with no hydronephrosis.   Electronically Signed   By: Skipper Cliche M.D.   On: 03/14/2015 15:22   Dg Abd Acute W/chest  03/14/2015   CLINICAL DATA:  Constipation for 1 week; nausea.  EXAM: DG ABDOMEN ACUTE W/ 1V CHEST  COMPARISON:  Chest radiograph February 23, 2015  FINDINGS: PA chest: There is residual patchy opacity in each upper lobe. The lungs are otherwise clear. Heart size and pulmonary vascularity are normal. No adenopathy. There is an azygos lobe on the right, an anatomic variant.  Supine and upright abdomen: There is moderate stool throughout the colon. There is no bowel dilatation. There are multiple air-fluid levels throughout the abdomen, however. No free air.  IMPRESSION: Multiple air-fluid levels without appreciable bowel dilatation. Question ileus. Enteritis could present similarly. Mild patchy opacity in the upper lobes remains without appreciable change compared to study from approximately 3 weeks prior. Question scarring versus foci of pneumonia. Given  the persistence of this finding, correlation with noncontrast enhanced chest CT may be helpful to further assess.   Electronically Signed   By: Lowella Grip III M.D.   On: 03/14/2015 13:44   I have personally reviewed and evaluated these images and lab results as part of my medical decision-making.   EKG Interpretation None      MDM   Final diagnoses:  Abdominal pain, recurrent    Patient presents to the ER for evaluation of abdominal pain, rectal pain and constipation. Patient was recently hospitalized for epidural abscess required surgery. There does not appear to be any surgical complications. Patient does not have any signs of infection. Patient reports passage of hard stool balls and needing to manually disimpact himself. Plain film x-ray, however, did not show any significant abnormality other than findings could be secondary to ileus. No obvious blockage and no significant stool burden. Patient underwent CT scan of abdomen and pelvis to rule out colitis, proctitis, other abnormalities. None are seen. There is also possible foci of pneumonia on  plain film, CT chest was performed. No obvious pneumonia is noted. Patient was reassured, has follow-up with his doctor in 2 days. Will increase fiber intake, take a stool softener as needed. Avoid laxatives.   Orpah Greek, MD 03/14/15 [phone]

## 2015-03-16 ENCOUNTER — Ambulatory Visit (INDEPENDENT_AMBULATORY_CARE_PROVIDER_SITE_OTHER): Payer: BLUE CROSS/BLUE SHIELD | Admitting: Family Medicine

## 2015-03-16 ENCOUNTER — Encounter: Payer: Self-pay | Admitting: Family Medicine

## 2015-03-16 VITALS — BP 130/74 | HR 78 | Temp 98.1°F | Resp 18 | Ht 68.0 in | Wt 154.0 lb

## 2015-03-16 DIAGNOSIS — G062 Extradural and subdural abscess, unspecified: Secondary | ICD-10-CM

## 2015-03-16 DIAGNOSIS — K5900 Constipation, unspecified: Secondary | ICD-10-CM

## 2015-03-16 MED ORDER — NALOXEGOL OXALATE 25 MG PO TABS: 25.0000 mg | ORAL_TABLET | Freq: Every day | ORAL | Status: DC

## 2015-03-16 NOTE — Progress Notes (Signed)
Subjective:    Patient ID: James Hays, male    DOB: 07/20/1950, 64 y.o.   MRN: [id number]  HPI  01/23/15 One week ago, the patient went to see his chiropractor due to some low back pain. Immediately upon manipulation of the spine, the patient developed searing pain in his lower back radiating into his right gluteus and down his right leg. Patient states that he can barely stand. He can barely walk. Patient states that his right leg feels like it's on fire. He also reports weakness in his right leg. He went to the emergency room. I reviewed the emergency room records. They placed the patient on high-dose prednisone. However his symptoms are not improving. He is in substantial pain today. He has a positive right leg raise. Muscle strength is 4 over 5 in the right leg with hip flexion and extension and knee flexion and extension. He has diminished reflexes at the knee but I believe is secondary to muscle spasm and guarding. He is severely tender to palpation in his right lower spine. He is unable to work.  At that time, my plan was: Patient has severe right-sided sciatica and is failing conservative therapy. He is already failed high-dose prednisone. I recommended Percocet 5/325 one to 2 tablets every 4 hours as needed for pain. I recommended ibuprofen 800 mg every 8 hours. I will schedule the patient for an MRI of the lumbar spine as soon as possible. He would likely benefit from epidural sterile injection  MRI revealed: 1. Prominent soft disc extrusion at L3-4 extending superiorly behind the body of L3 on the right compressing the right L3 nerve. 2. Chronic degenerative disc disease at L4-5 without neural Impingement.   03/16/15 Since that time, underwent lumbar discectomy 02/04/15 by Dr. [name].  Post operative course was complicated by a lumbar epidural abscess and bacteremia and he required wound exploration and drain placement on 8/17.  This Friday, the patient just completed IV anabiotic  service PICC line. He is scheduled to see the infectious disease specialist Dr. Megan Salon on September 27 to clear the patient from a standpoint of infectious disease. He does not have a follow-up appointment planned with the neurosurgeon to clear him to return to work. Currently he is out of work until October 28. In no way is he ready to resume work at the present time. He is still ambulating with a cane with significant difficulty. He is also dealing with constipation. He recently went to the hospital with an ileus stemming from his narcotics which he has to take once or twice a day for back pain. He is taking a stool softener with minimal benefit. Lab work is also significant for elevated calcium. Patient believes in the past and he was told he has hyperparathyroidism. Past Medical History  Diagnosis Date  . Hyperlipidemia    Past Surgical History  Procedure Laterality Date  . Vasectomy    . Fracture surgery      L foot  . Hydrocele excision    . Hernia repair      inguinal and umbilical  . Back surgery    . Placement of lumbar drain N/A 02/11/2015    Procedure: PLACEMENT OF LUMBAR DRAIN;  Surgeon: [name], MD;  Location: [place] [facility];  Service: Neurosurgery;  Laterality: N/A;  . Lumbar wound debridement N/A 02/14/2015    Procedure: EXPLORATION LUMBAR WOUND, REPAIR OF CSF LEAK;  Surgeon: [name], MD;  Location: [place] [facility];  Service: Neurosurgery;  Laterality:  N/A;   Current Outpatient Prescriptions on File Prior to Visit  Medication Sig Dispense Refill  . cyclobenzaprine (FLEXERIL) 10 MG tablet Take 1 tablet (10 mg total) by mouth 3 (three) times daily as needed for muscle spasms. 90 tablet 0  . oxyCODONE-acetaminophen (ROXICET) 5-325 MG per tablet Take 1-2 tablets by mouth every 4 (four) hours as needed for severe pain. 60 tablet 0  . predniSONE (DELTASONE) 50 MG tablet 1 tablet daily for 7 days (Patient not taking: Reported on 02/10/2015) 7 tablet 0  . tadalafil (CIALIS)  10 MG tablet Take 10 mg by mouth daily as needed for erectile dysfunction.     No current facility-administered medications on file prior to visit.   No Known Allergies Social History   Social History  . Marital Status: Married    Spouse Name: N/A  . Number of Children: N/A  . Years of Education: N/A   Occupational History  . Not on file.   Social History Main Topics  . Smoking status: Current Every Day Smoker -- 1.00 packs/day    Types: Cigarettes  . Smokeless tobacco: Never Used  . Alcohol Use: No  . Drug Use: No  . Sexual Activity: Yes     Comment: work for [employer]    Other Topics Concern  . Not on file   Social History Narrative     Review of Systems  All other systems reviewed and are negative.      Objective:   Physical Exam  Constitutional: He appears distressed.  Cardiovascular: Normal rate, regular rhythm and normal heart sounds.   Pulmonary/Chest: Effort normal and breath sounds normal.  Musculoskeletal:       Lumbar back: He exhibits decreased range of motion, tenderness, bony tenderness, pain and spasm.  Vitals reviewed.         Assessment & Plan:  Constipation, unspecified constipation type - Plan: naloxegol oxalate (MOVANTIK) 25 MG TABS tablet  Hypercalcemia - Plan: Parathyroid hormone, intact (no Ca)  Epidural abscess  Begin movantik 25 mg by mouth daily to prevent constipation secondary to his narcotic. Gradually wean away from the narcotic. There is no evidence of systemic illness today. He is afebrile. He has been off IV and robotic for approximately 1 week. I encouraged the patient to follow up with infectious disease to completely clear him from the infection. I believe he would likely benefit from a blood culture in 2 weeks off in a box to ensure resolution of the Staphylococcus aureus bacteremia. I will also schedule the patient to follow-up with neurosurgery from one month  From today to clear the patient to return to work

## 2015-03-17 ENCOUNTER — Encounter: Payer: Self-pay | Admitting: Family Medicine

## 2015-03-17 ENCOUNTER — Ambulatory Visit (INDEPENDENT_AMBULATORY_CARE_PROVIDER_SITE_OTHER): Payer: BLUE CROSS/BLUE SHIELD | Admitting: Family Medicine

## 2015-03-17 VITALS — BP 132/68 | HR 84 | Temp 98.6°F | Resp 20 | Ht 68.0 in | Wt 154.0 lb

## 2015-03-17 DIAGNOSIS — F32A Depression, unspecified: Secondary | ICD-10-CM

## 2015-03-17 DIAGNOSIS — N528 Other male erectile dysfunction: Secondary | ICD-10-CM | POA: Diagnosis not present

## 2015-03-17 DIAGNOSIS — F329 Major depressive disorder, single episode, unspecified: Secondary | ICD-10-CM | POA: Diagnosis not present

## 2015-03-17 LAB — PARATHYROID HORMONE, INTACT (NO CA): PTH: 79 pg/mL — ABNORMAL HIGH (ref 14–64)

## 2015-03-17 MED ORDER — ESCITALOPRAM OXALATE 10 MG PO TABS: 10.0000 mg | ORAL_TABLET | Freq: Every day | ORAL | Status: DC

## 2015-03-17 MED ORDER — SILDENAFIL CITRATE 100 MG PO TABS: 50.0000 mg | ORAL_TABLET | Freq: Every day | ORAL | Status: DC | PRN

## 2015-03-17 NOTE — Progress Notes (Signed)
Subjective:    Patient ID: James Hays, male    DOB: November 29, 1950, 64 y.o.   MRN: [id number]  HPI Patient reports depression and anxiety. He reports anhedonia. He reports poor concentration. He reports insomnia. He has very poor energy. This is been going on for several years. He was on antidepressive medication in [place]. Over the last several months the depression has worsened after all the complications from his back surgery. He is interested in resuming antidepressive medication at the present time. He denies any suicidal ideation. He also has erectile dysfunction. Cialis has not worked in the past but Viagra has.  Past Medical History  Diagnosis Date  . Hyperlipidemia    Past Surgical History  Procedure Laterality Date  . Vasectomy    . Fracture surgery      L foot  . Hydrocele excision    . Hernia repair      inguinal and umbilical  . Back surgery    . Placement of lumbar drain N/A 02/11/2015    Procedure: PLACEMENT OF LUMBAR DRAIN;  Surgeon: [name], MD;  Location: [place] [facility];  Service: Neurosurgery;  Laterality: N/A;  . Lumbar wound debridement N/A 02/14/2015    Procedure: EXPLORATION LUMBAR WOUND, REPAIR OF CSF LEAK;  Surgeon: [name], MD;  Location: [place] [facility];  Service: Neurosurgery;  Laterality: N/A;   Current Outpatient Prescriptions on File Prior to Visit  Medication Sig Dispense Refill  . cyclobenzaprine (FLEXERIL) 10 MG tablet Take 1 tablet (10 mg total) by mouth 3 (three) times daily as needed for muscle spasms. 90 tablet 0  . naloxegol oxalate (MOVANTIK) 25 MG TABS tablet Take 1 tablet (25 mg total) by mouth daily. 30 tablet 0  . oxyCODONE-acetaminophen (ROXICET) 5-325 MG per tablet Take 1-2 tablets by mouth every 4 (four) hours as needed for severe pain. 60 tablet 0  . tadalafil (CIALIS) 10 MG tablet Take 10 mg by mouth daily as needed for erectile dysfunction.    . predniSONE (DELTASONE) 50 MG tablet 1 tablet daily for 7 days (Patient not  taking: Reported on 03/17/2015) 7 tablet 0   No current facility-administered medications on file prior to visit.   No Known Allergies Social History   Social History  . Marital Status: Married    Spouse Name: N/A  . Number of Children: N/A  . Years of Education: N/A   Occupational History  . Not on file.   Social History Main Topics  . Smoking status: Current Every Day Smoker -- 1.00 packs/day    Types: Cigarettes  . Smokeless tobacco: Never Used  . Alcohol Use: No  . Drug Use: No  . Sexual Activity: Yes     Comment: work for [employer]    Other Topics Concern  . Not on file   Social History Narrative    Review of Systems  All other systems reviewed and are negative.      Objective:   Physical Exam  Cardiovascular: Normal rate, regular rhythm and normal heart sounds.   Pulmonary/Chest: Effort normal and breath sounds normal.  Psychiatric: He has a normal mood and affect. His behavior is normal. Judgment and thought content normal.  Vitals reviewed.         Assessment & Plan:  Depression - Plan: escitalopram (LEXAPRO) 10 MG tablet  Other male erectile dysfunction - Plan: sildenafil (VIAGRA) 100 MG tablet  Begin Lexapro 10 mg by mouth daily. Recheck in 4 weeks. Use Viagra 50-100 mg every day as needed  for erectile dysfunction

## 2015-03-19 LAB — FUNGUS CULTURE W SMEAR: Fungal Smear: NONE SEEN

## 2015-03-20 ENCOUNTER — Encounter: Payer: Self-pay | Admitting: Family Medicine

## 2015-03-23 ENCOUNTER — Telehealth: Payer: Self-pay | Admitting: Family Medicine

## 2015-03-23 NOTE — Telephone Encounter (Signed)
If he is not on prednisone, he could co on motrin.  Would he like to try effexor xr 75 mg poqam instead of lexapro?

## 2015-03-23 NOTE — Telephone Encounter (Signed)
Pt wants to know if he can now stop taking tylenol and go back to taking Aleve and Motrin?? Also he had to stop the Lexapro as it caused him to have paranoid delusions.

## 2015-03-23 NOTE — Telephone Encounter (Signed)
lmtrc

## 2015-03-23 NOTE — Telephone Encounter (Signed)
Patient has question about taking something else besides tylenol  [phone]

## 2015-03-28 ENCOUNTER — Encounter: Payer: Self-pay | Admitting: Internal Medicine

## 2015-03-28 ENCOUNTER — Ambulatory Visit (INDEPENDENT_AMBULATORY_CARE_PROVIDER_SITE_OTHER): Payer: BLUE CROSS/BLUE SHIELD | Admitting: Internal Medicine

## 2015-03-28 VITALS — BP 126/78 | HR 98 | Temp 97.9°F | Wt 154.2 lb

## 2015-03-28 DIAGNOSIS — Z23 Encounter for immunization: Secondary | ICD-10-CM | POA: Diagnosis not present

## 2015-03-28 DIAGNOSIS — G061 Intraspinal abscess and granuloma: Secondary | ICD-10-CM

## 2015-03-28 LAB — C-REACTIVE PROTEIN

## 2015-03-28 NOTE — Assessment & Plan Note (Signed)
Despite receiving only 4 weeks of IV antibiotic therapy he is clinically improving. I will repeat his sedimentation rate and C-reactive protein before making a decision whether or not to extend antibiotic therapy with oral cephalexin. I will also check with [facility] to see why his antibiotics were stopped early. They will call me if there are any signs of worsening or returning infection between now and his follow-up visit with me in 6 weeks.

## 2015-03-28 NOTE — Progress Notes (Addendum)
Patient ID: James Hays, male   DOB: 1951-01-13, 64 y.o.   MRN: [id number]         [facility] for Infectious Disease  Patient Active Problem List   Diagnosis Date Noted  . Abscess in epidural space of lumbar spine 02/15/2015    Priority: High  . Staphylococcus aureus bacteremia     Priority: High  . Chest pain   . HLD (hyperlipidemia)   . Other constipation   . Other postoperative complication involving nervous system   . Surgery, other elective   . S/P diskectomy   . Post-operative complication 53/20/2334  . Postoperative CSF leak 02/10/2015    Patient's Medications  New Prescriptions   No medications on file  Previous Medications   CYCLOBENZAPRINE (FLEXERIL) 10 MG TABLET    Take 1 tablet (10 mg total) by mouth 3 (three) times daily as needed for muscle spasms.   NALOXEGOL OXALATE (MOVANTIK) 25 MG TABS TABLET    Take 1 tablet (25 mg total) by mouth daily.   NAPROXEN SODIUM (ANAPROX) 220 MG TABLET    Take 220 mg by mouth 2 (two) times daily with a meal.   OXYCODONE-ACETAMINOPHEN (ROXICET) 5-325 MG PER TABLET    Take 1-2 tablets by mouth every 4 (four) hours as needed for severe pain.   SILDENAFIL (VIAGRA) 100 MG TABLET    Take 0.5-1 tablets (50-100 mg total) by mouth daily as needed for erectile dysfunction.   TADALAFIL (CIALIS) 10 MG TABLET    Take 10 mg by mouth daily as needed for erectile dysfunction.  Modified Medications   No medications on file  Discontinued Medications   ESCITALOPRAM (LEXAPRO) 10 MG TABLET    Take 1 tablet (10 mg total) by mouth daily.   PREDNISONE (DELTASONE) 50 MG TABLET    1 tablet daily for 7 days    Subjective: James Hays is in with his wife for his hospital follow-up visit. He developed postoperative lumbar epidural abscess with transient MSSA bacteremia. He underwent incision and drainage of the epidural abscess and had repair of a dural tear on 02/14/2015. He had no evidence of endocarditis by transthoracic echocardiogram or clinical  exam. Repeat blood cultures were negative. He was discharged on IV cefazolin with a plan to treat for 6 full weeks through 03/24/2015. For reasons I cannot explain his antibiotics were stopped on 03/10/2015 and his PICC was removed.  Fortunately he is continuing to improve. He has not had any fever, chills or sweats. He is no longer needing any narcotic pain medication. He is taking Aleve twice daily. His pain is down to about 2 on a scale of 10. He is more active. He no longer needs a walker.  His wife has noted what appears to be some swelling that is new inferior to his wound. She noticed it several days ago.  Review of Systems: Pertinent items are noted in HPI.  Past Medical History  Diagnosis Date  . Hyperlipidemia   . Hyperparathyroidism   . Hypercalcemia   . Depression   . Erectile dysfunction     Social History  Substance Use Topics  . Smoking status: Current Every Day Smoker -- 1.00 packs/day    Types: Cigarettes  . Smokeless tobacco: Never Used  . Alcohol Use: No    Family History  Problem Relation Age of Onset  . Hypertension Mother   . Diabetes Mother   . Heart disease Father 44  . Hyperlipidemia Father   . Diabetes Brother  No Known Allergies  Objective: Filed Vitals:   03/28/15 1012  BP: 126/78  Pulse: 98  Temp: 97.9 F (36.6 C)  Weight: 154 lb 4 oz (69.967 kg)   Body mass index is 23.46 kg/(m^2).  General: He looks much better than when I last saw him. His weight is down 16 pounds over the past 2 months Skin: Previous right arm PICC site is healing. No rash Lungs: Clear Cor: Regular S1 and S2 with no murmur Lumbar incision is healing nicely. There is some prominent fatty tissue inferior to the wound but there is no redness, warmth or fluctuance. He has an excoriated lesion on his right buttock which does not appear infected  Lab Results SED RATE (mm/hr)  Date Value  03/06/2015 95*  02/13/2015 88*  02/10/2015 95*   CRP (mg/dL)  Date Value   03/06/2015 0.7  02/13/2015 14.4*     Problem List Items Addressed This Visit      High   Abscess in epidural space of lumbar spine    Despite receiving only 4 weeks of IV antibiotic therapy he is clinically improving. I will repeat his sedimentation rate and C-reactive protein before making a decision whether or not to extend antibiotic therapy with oral cephalexin. I will also check with [facility] to see why his antibiotics were stopped early. They will call me if there are any signs of worsening or returning infection between now and his follow-up visit with me in 6 weeks.      Relevant Orders   Sedimentation rate   C-reactive protein       Michel Bickers, MD [facility] for Infectious Disease [place] [phone] pager   [phone] cell 03/28/2015, 11:21 AM   Addendum:  SED RATE (mm/hr)  Date Value  03/28/2015 47*  03/06/2015 95*  02/13/2015 88*   CRP (mg/dL)  Date Value  03/28/2015 <0.5  03/06/2015 0.7  02/13/2015 14.4*   Given that he did not complete a full course of intravenous antibiotic therapy and his sedimentation rate remains somewhat elevated I will put him on oral cephalexin and continue it until he follows up with me. He is in agreement with that plan.  Michel Bickers, MD [facility] for Infectious [place] [facility] [phone] pager   [phone] cell 03/29/2015, 1:49 PM

## 2015-03-29 ENCOUNTER — Encounter: Payer: Self-pay | Admitting: Internal Medicine

## 2015-03-29 LAB — AFB CULTURE WITH SMEAR (NOT AT ARMC)
ACID FAST SMEAR: NONE SEEN
Acid Fast Smear: NONE SEEN

## 2015-03-29 LAB — SEDIMENTATION RATE: SED RATE: 47 mm/h — AB (ref 0–20)

## 2015-03-30 ENCOUNTER — Other Ambulatory Visit: Payer: Self-pay | Admitting: Internal Medicine

## 2015-03-30 DIAGNOSIS — G061 Intraspinal abscess and granuloma: Secondary | ICD-10-CM

## 2015-03-30 MED ORDER — CEPHALEXIN 500 MG PO CAPS: 500.0000 mg | ORAL_CAPSULE | Freq: Four times a day (QID) | ORAL | Status: DC

## 2015-03-30 NOTE — Telephone Encounter (Signed)
Patient notified

## 2015-03-30 NOTE — Telephone Encounter (Signed)
Patient called and advised he spoke with the doctor yesterday 03/29/15 and was to have antibiotics called into his pharmacy. He checked this morning and nothing was there. Advised the patient will ask the doctor and call him back.

## 2015-03-31 ENCOUNTER — Encounter: Payer: Self-pay | Admitting: Family Medicine

## 2015-03-31 NOTE — Telephone Encounter (Signed)
Sent pt message through my chart.

## 2015-03-31 NOTE — Telephone Encounter (Signed)
LMTRC

## 2015-04-03 NOTE — Telephone Encounter (Signed)
No return call or message - closing message

## 2015-04-04 MED ORDER — VENLAFAXINE HCL ER 75 MG PO CP24: 75.0000 mg | ORAL_CAPSULE | Freq: Every day | ORAL | Status: DC

## 2015-04-25 ENCOUNTER — Ambulatory Visit (INDEPENDENT_AMBULATORY_CARE_PROVIDER_SITE_OTHER): Payer: BLUE CROSS/BLUE SHIELD | Admitting: Family Medicine

## 2015-04-25 ENCOUNTER — Encounter: Payer: Self-pay | Admitting: Family Medicine

## 2015-04-25 VITALS — BP 136/80 | HR 82 | Temp 97.6°F | Resp 16 | Ht 68.0 in | Wt 161.0 lb

## 2015-04-25 DIAGNOSIS — J209 Acute bronchitis, unspecified: Secondary | ICD-10-CM | POA: Diagnosis not present

## 2015-04-25 MED ORDER — ALBUTEROL SULFATE 108 (90 BASE) MCG/ACT IN AEPB: 2.0000 | INHALATION_SPRAY | RESPIRATORY_TRACT | Status: DC | PRN

## 2015-04-25 MED ORDER — HYDROCODONE-HOMATROPINE 5-1.5 MG/5ML PO SYRP: 5.0000 mL | ORAL_SOLUTION | Freq: Three times a day (TID) | ORAL | Status: DC | PRN

## 2015-04-25 NOTE — Patient Instructions (Signed)
Continue your current antibiotics Use cough syrup Use albuterol inhaler Humidifier in bedroom F/U as needed

## 2015-04-25 NOTE — Progress Notes (Signed)
Patient ID: James Hays, male   DOB: Nov 22, 1950, 64 y.o.   MRN: [id number]   Subjective:    Patient ID: James Hays, male    DOB: Mar 27, 1951, 64 y.o.   MRN: [id number]  Patient presents for Illness  patient here cough which is nonproductive and chest congestion for the past 2 weeks. Initially started as some sinus drainage. He is on chronic antibiotics because of an epidural abscess he is on Keflex 4 times a day. He is been taking TheraFlu as well as Alka-Seltzer with minimal improvement. He is a smoker. He is also noticed some tightness and wheezing which prompted the visit today. He has not had any significant fever no known sick contacts.    Review Of Systems:  GEN- denies fatigue, fever, weight loss,weakness, recent illness HEENT- denies eye drainage, change in vision, +nasal discharge, CVS- denies chest pain, palpitations RESP- denies SOB, +cough, +wheeze ABD- denies N/V, change in stools, abd pain GU- denies dysuria, hematuria, dribbling, incontinence MSK- denies joint pain, muscle aches, injury Neuro- denies headache, dizziness, syncope, seizure activity       Objective:    BP 136/80 mmHg  Pulse 82  Temp(Src) 97.6 F (36.4 C) (Oral)  Resp 16  Ht 5\' 8"  (1.727 m)  Wt 161 lb (73.029 kg)  BMI 24.49 kg/m2 GEN- NAD, alert and oriented x3 HEENT- PERRL, EOMI, non injected sclera, pink conjunctiva, MMM, oropharynx clear Neck- Supple, no LAD CVS- RRR, no murmur RESP- occasional wheeze, no rhonchi, no rales, normal WOB  EXT- No edema Pulses- Radial  2+        Assessment & Plan:      Problem List Items Addressed This Visit    None    Visit Diagnoses    Acute bronchitis, unspecified organism    -  Primary    Will not add any antibiotics at this time dbout PNA, will give albuterol as needed, Cough syrup, Hold on prednisone because the previous abscess, sats are good as well       Note: This dictation was prepared with Dragon dictation along with smaller phrase  technology. Any transcriptional errors that result from this process are unintentional.

## 2015-05-09 ENCOUNTER — Encounter: Payer: Self-pay | Admitting: Internal Medicine

## 2015-05-09 ENCOUNTER — Ambulatory Visit (INDEPENDENT_AMBULATORY_CARE_PROVIDER_SITE_OTHER): Payer: BLUE CROSS/BLUE SHIELD | Admitting: Internal Medicine

## 2015-05-09 VITALS — BP 133/88 | HR 89 | Temp 97.9°F | Ht 68.0 in | Wt 162.4 lb

## 2015-05-09 DIAGNOSIS — G061 Intraspinal abscess and granuloma: Secondary | ICD-10-CM

## 2015-05-09 DIAGNOSIS — R300 Dysuria: Secondary | ICD-10-CM | POA: Insufficient documentation

## 2015-05-09 LAB — C-REACTIVE PROTEIN

## 2015-05-09 NOTE — Progress Notes (Signed)
[facility] Hays for Infectious Disease  Patient Active Problem List   Diagnosis Date Noted  . Abscess in epidural space of lumbar spine 02/15/2015    Priority: High  . Staphylococcus aureus bacteremia     Priority: High  . Dysuria 05/09/2015  . Chest pain   . HLD (hyperlipidemia)   . Other constipation   . Other postoperative complication involving nervous system   . Surgery, other elective   . S/P diskectomy   . Post-operative complication 50/93/2671  . Postoperative CSF leak 02/10/2015    Patient's Medications  New Prescriptions   No medications on file  Previous Medications   ALBUTEROL SULFATE (PROAIR RESPICLICK) 245 (90 BASE) MCG/ACT AEPB    Inhale 2 puffs into the lungs every 4 (four) hours as needed.   NAPROXEN SODIUM (ANAPROX) 220 MG TABLET    Take 220 mg by mouth 2 (two) times daily with a meal.   VENLAFAXINE XR (EFFEXOR-XR) 75 MG 24 HR CAPSULE    Take 1 capsule (75 mg total) by mouth daily with breakfast.  Modified Medications   No medications on file  Discontinued Medications   CEPHALEXIN (KEFLEX) 500 MG CAPSULE    Take 1 capsule (500 mg total) by mouth 4 (four) times daily.   CYCLOBENZAPRINE (FLEXERIL) 10 MG TABLET    Take 1 tablet (10 mg total) by mouth 3 (three) times daily as needed for muscle spasms.   HYDROCODONE-HOMATROPINE (HYCODAN) 5-1.5 MG/5ML SYRUP    Take 5 mLs by mouth every 8 (eight) hours as needed for cough.    Subjective: James Hays is in for his routine follow-up visit. He is now completed almost 10 weeks of total antibiotic therapy for his MSSA bacteremia and postoperative lumbar infection. He has been on oral cephalexin for the past 6 weeks. He states he is hoping he can get off antibiotics. He feels like the cephalexin is causing some itching and burning on urination. His back pain is markedly improved. He has some low back soreness that he rates about 3 out of 10. He is only needing occasional ibuprofen. He is resuming his usual  activities. His appetite has improved and he has gained weight.  Review of Systems: Review of Systems  Constitutional: Negative for fever and chills.  Genitourinary: Positive for dysuria.  Skin: Negative for rash.    Past Medical History  Diagnosis Date  . Hyperlipidemia   . Hyperparathyroidism ([place])   . Hypercalcemia   . Depression   . Erectile dysfunction     Social History  Substance Use Topics  . Smoking status: Current Every Day Smoker -- 1.00 packs/day    Types: Cigarettes  . Smokeless tobacco: Never Used  . Alcohol Use: No    Family History  Problem Relation Age of Onset  . Hypertension Mother   . Diabetes Mother   . Heart disease Father 19  . Hyperlipidemia Father   . Diabetes Brother     No Known Allergies  Objective: Filed Vitals:   05/09/15 0955  BP: 133/88  Pulse: 89  Temp: 97.9 F (36.6 C)  TempSrc: Oral  Height: 5\' 8"  (1.727 m)  Weight: 162 lb 6.4 oz (73.664 kg)   Body mass index is 24.7 kg/(m^2).  Physical Exam  Constitutional: No distress.  He is smiling and in good spirits.  Cardiovascular: Regular rhythm and normal heart sounds.   No murmur heard. Pulmonary/Chest: Effort normal and breath sounds normal. No respiratory distress.  Musculoskeletal:  His lumbar incision is well healed.    Lab Results SED RATE (mm/hr)  Date Value  03/28/2015 47*  03/06/2015 95*  02/13/2015 88*   CRP (mg/dL)  Date Value  03/28/2015 <0.5  03/06/2015 0.7  02/13/2015 14.4*     Problem List Items Addressed This Visit      High   Abscess in epidural space of lumbar spine    He is doing much better now. I'm hopeful that his MSSA lumbar infection has been cured. I will repeat his inflammatory markers today and have him stop his cephalexin. He will follow-up in 6 weeks.      Relevant Orders   C-reactive protein   Sedimentation rate     Unprioritized   Dysuria - Primary    There is unlikely that his dysuria is due to cephalexin. I will check a  urinalysis and urine culture and call him with the results.      Relevant Orders   Urinalysis, Routine w reflex microscopic   CULTURE, URINE COMPREHENSIVE       Michel Bickers, MD [facility] for [place] [phone] pager   [phone] cell 05/09/2015, 10:47 AM

## 2015-05-09 NOTE — Assessment & Plan Note (Signed)
There is unlikely that his dysuria is due to cephalexin. I will check a urinalysis and urine culture and call him with the results.

## 2015-05-09 NOTE — Assessment & Plan Note (Signed)
He is doing much better now. I'm hopeful that his MSSA lumbar infection has been cured. I will repeat his inflammatory markers today and have him stop his cephalexin. He will follow-up in 6 weeks.

## 2015-05-10 LAB — URINALYSIS, ROUTINE W REFLEX MICROSCOPIC
Bilirubin Urine: NEGATIVE
Glucose, UA: NEGATIVE
Hgb urine dipstick: NEGATIVE
Ketones, ur: NEGATIVE
LEUKOCYTES UA: NEGATIVE
Nitrite: NEGATIVE
PROTEIN: NEGATIVE
Specific Gravity, Urine: 1.012 (ref 1.001–1.035)
pH: 7.5 (ref 5.0–8.0)

## 2015-05-10 LAB — SEDIMENTATION RATE: SED RATE: 32 mm/h — AB (ref 0–20)

## 2015-05-10 LAB — CULTURE, URINE COMPREHENSIVE
COLONY COUNT: NO GROWTH
Organism ID, Bacteria: NO GROWTH

## 2015-05-24 ENCOUNTER — Ambulatory Visit (INDEPENDENT_AMBULATORY_CARE_PROVIDER_SITE_OTHER): Payer: BLUE CROSS/BLUE SHIELD | Admitting: Family Medicine

## 2015-05-24 ENCOUNTER — Encounter: Payer: Self-pay | Admitting: Family Medicine

## 2015-05-24 VITALS — BP 128/92 | HR 80 | Temp 98.4°F | Resp 20 | Ht 68.0 in | Wt 164.0 lb

## 2015-05-24 DIAGNOSIS — R300 Dysuria: Secondary | ICD-10-CM | POA: Diagnosis not present

## 2015-05-24 LAB — URINALYSIS, ROUTINE W REFLEX MICROSCOPIC
BILIRUBIN URINE: NEGATIVE
GLUCOSE, UA: NEGATIVE
Hgb urine dipstick: NEGATIVE
KETONES UR: NEGATIVE
Leukocytes, UA: NEGATIVE
Nitrite: NEGATIVE
PROTEIN: NEGATIVE
Specific Gravity, Urine: 1.015 (ref 1.001–1.035)
pH: 7.5 (ref 5.0–8.0)

## 2015-05-24 MED ORDER — CIPROFLOXACIN HCL 500 MG PO TABS: 500.0000 mg | ORAL_TABLET | Freq: Two times a day (BID) | ORAL | Status: DC

## 2015-05-24 NOTE — Progress Notes (Signed)
Subjective:    Patient ID: James Hays, male    DOB: 1951-02-19, 64 y.o.   MRN: [id number]  HPI Patient reports a 3-week history of severe dysuria. He states that it feels like he's "pissing razor blades."  He saw his infectious disease doctor who performed a urinalysis which was negative. Repeat urinalysis today was negative. He states the pain radiates down the shaft of his penis into his rectum. It occurs every time he goes to the bathroom. He denies any fevers or chills or hematuria.  He denies adamantly any possibility for STI. There is no trichomoniasis today on urinalysis. Prostate exam is unremarkable. There is no swelling or tenderness to palpation Past Medical History  Diagnosis Date  . Hyperlipidemia   . Hyperparathyroidism ([place])   . Hypercalcemia   . Depression   . Erectile dysfunction    Past Surgical History  Procedure Laterality Date  . Vasectomy    . Fracture surgery      L foot  . Hydrocele excision    . Hernia repair      inguinal and umbilical  . Back surgery    . Placement of lumbar drain N/A 02/11/2015    Procedure: PLACEMENT OF LUMBAR DRAIN;  Surgeon: [name], MD;  Location: [place] [facility];  Service: Neurosurgery;  Laterality: N/A;  . Lumbar wound debridement N/A 02/14/2015    Procedure: EXPLORATION LUMBAR WOUND, REPAIR OF CSF LEAK;  Surgeon: [name], MD;  Location: [place] [facility];  Service: Neurosurgery;  Laterality: N/A;   Current Outpatient Prescriptions on File Prior to Visit  Medication Sig Dispense Refill  . Albuterol Sulfate (PROAIR RESPICLICK) 123XX123 (90 BASE) MCG/ACT AEPB Inhale 2 puffs into the lungs every 4 (four) hours as needed. 1 each 1   No current facility-administered medications on file prior to visit.   No Known Allergies Social History   Social History  . Marital Status: Married    Spouse Name: N/A  . Number of Children: N/A  . Years of Education: N/A   Occupational History  . Not on file.   Social History Main Topics   . Smoking status: Current Every Day Smoker -- 1.00 packs/day    Types: Cigarettes  . Smokeless tobacco: Never Used  . Alcohol Use: No  . Drug Use: No  . Sexual Activity: Yes     Comment: work for [employer]    Other Topics Concern  . Not on file   Social History Narrative      Review of Systems  All other systems reviewed and are negative.      Objective:   Physical Exam  Cardiovascular: Normal rate, regular rhythm and normal heart sounds.   Pulmonary/Chest: Effort normal and breath sounds normal. No respiratory distress. He has no wheezes. He has no rales.  Genitourinary: Rectum normal and prostate normal. Cremasteric reflex is present. Right testis shows no mass and no tenderness. Left testis shows no mass and no tenderness.  Vitals reviewed.         Assessment & Plan:  Burning with urination - Plan: Urinalysis, Routine w reflex microscopic (not at [facility]), GC/Chlamydia Probe Amp, ciprofloxacin (CIPRO) 500 MG tablet  begin Cipro 500 mg by mouth twice a day for 10 days for possible prostatitis. I will also send a gonorrhea chlamydia probe to rule out STI even though the patient is adamant that this is impossible. Symptoms sound like a possible STI although the radiation into his rectum swelling makes prostatitis a possibility even of  his prostate exam is normal

## 2015-05-24 NOTE — Addendum Note (Signed)
Addended by: Haskel Khan A on: 05/24/2015 10:28 AM   Modules accepted: Orders

## 2015-05-25 LAB — GC/CHLAMYDIA PROBE AMP
CT Probe RNA: NEGATIVE
GC Probe RNA: NEGATIVE

## 2015-05-29 ENCOUNTER — Encounter: Payer: Self-pay | Admitting: Family Medicine

## 2015-05-30 ENCOUNTER — Other Ambulatory Visit: Payer: Self-pay | Admitting: Family Medicine

## 2015-05-30 DIAGNOSIS — R300 Dysuria: Secondary | ICD-10-CM

## 2015-06-09 ENCOUNTER — Ambulatory Visit (HOSPITAL_COMMUNITY)
Admission: RE | Admit: 2015-06-09 | Discharge: 2015-06-09 | Disposition: A | Discharge status: Home / Self Care | Payer: BLUE CROSS/BLUE SHIELD | Source: Ambulatory Visit | Attending: Urology | Admitting: Urology

## 2015-06-09 ENCOUNTER — Encounter (HOSPITAL_COMMUNITY): Payer: Self-pay | Admitting: *Deleted

## 2015-06-09 ENCOUNTER — Ambulatory Visit (INDEPENDENT_AMBULATORY_CARE_PROVIDER_SITE_OTHER): Payer: BLUE CROSS/BLUE SHIELD | Admitting: Urology

## 2015-06-09 ENCOUNTER — Other Ambulatory Visit: Payer: Self-pay | Admitting: Urology

## 2015-06-09 ENCOUNTER — Ambulatory Visit (HOSPITAL_COMMUNITY)
Admission: AD | Admit: 2015-06-09 | Discharge: 2015-06-09 | Disposition: A | Discharge status: Home / Self Care | Payer: BLUE CROSS/BLUE SHIELD | Source: Ambulatory Visit | Attending: Urology | Admitting: Urology

## 2015-06-09 ENCOUNTER — Encounter (HOSPITAL_COMMUNITY)
Admission: AD | Disposition: A | Discharge status: Home / Self Care | Payer: Self-pay | Source: Ambulatory Visit | Attending: Urology

## 2015-06-09 ENCOUNTER — Ambulatory Visit (HOSPITAL_COMMUNITY): Payer: BLUE CROSS/BLUE SHIELD

## 2015-06-09 ENCOUNTER — Ambulatory Visit (HOSPITAL_COMMUNITY): Payer: BLUE CROSS/BLUE SHIELD | Admitting: Anesthesiology

## 2015-06-09 DIAGNOSIS — R300 Dysuria: Secondary | ICD-10-CM | POA: Diagnosis not present

## 2015-06-09 DIAGNOSIS — F17210 Nicotine dependence, cigarettes, uncomplicated: Secondary | ICD-10-CM | POA: Insufficient documentation

## 2015-06-09 DIAGNOSIS — E7800 Pure hypercholesterolemia, unspecified: Secondary | ICD-10-CM | POA: Insufficient documentation

## 2015-06-09 DIAGNOSIS — R351 Nocturia: Secondary | ICD-10-CM | POA: Diagnosis not present

## 2015-06-09 DIAGNOSIS — N200 Calculus of kidney: Secondary | ICD-10-CM

## 2015-06-09 DIAGNOSIS — N201 Calculus of ureter: Secondary | ICD-10-CM | POA: Diagnosis not present

## 2015-06-09 DIAGNOSIS — N202 Calculus of kidney with calculus of ureter: Secondary | ICD-10-CM | POA: Diagnosis not present

## 2015-06-09 DIAGNOSIS — Z79899 Other long term (current) drug therapy: Secondary | ICD-10-CM | POA: Diagnosis not present

## 2015-06-09 DIAGNOSIS — Z7982 Long term (current) use of aspirin: Secondary | ICD-10-CM | POA: Insufficient documentation

## 2015-06-09 HISTORY — PX: CYSTOSCOPY WITH URETEROSCOPY: SHX5123

## 2015-06-09 HISTORY — PX: STONE EXTRACTION WITH BASKET: SHX5318

## 2015-06-09 LAB — BASIC METABOLIC PANEL
Anion gap: 7 (ref 5–15)
BUN: 11 mg/dL (ref 6–20)
CALCIUM: 11.6 mg/dL — AB (ref 8.9–10.3)
CO2: 26 mmol/L (ref 22–32)
CREATININE: 0.88 mg/dL (ref 0.61–1.24)
Chloride: 109 mmol/L (ref 101–111)
GFR calc Af Amer: >60 mL/min (ref >60)
GLUCOSE: 84 mg/dL (ref 65–99)
Potassium: 3.4 mmol/L — ABNORMAL LOW (ref 3.5–5.1)
SODIUM: 142 mmol/L (ref 135–145)

## 2015-06-09 LAB — CBC
HCT: 48.1 % (ref 39.0–52.0)
Hemoglobin: 16.2 g/dL (ref 13.0–17.0)
MCH: 31.5 pg (ref 26.0–34.0)
MCHC: 33.7 g/dL (ref 30.0–36.0)
MCV: 93.4 fL (ref 78.0–100.0)
PLATELETS: 275 10*3/uL (ref 150–400)
RBC: 5.15 MIL/uL (ref 4.22–5.81)
RDW: 13.8 % (ref 11.5–15.5)
WBC: 8.1 10*3/uL (ref 4.0–10.5)

## 2015-06-09 SURGERY — CYSTOSCOPY WITH URETEROSCOPY
Anesthesia: General

## 2015-06-09 MED ORDER — ONDANSETRON HCL 4 MG/2ML IJ SOLN
INTRAMUSCULAR | Status: AC
  Filled 2015-06-09: qty 2

## 2015-06-09 MED ORDER — DEXAMETHASONE SODIUM PHOSPHATE 4 MG/ML IJ SOLN
INTRAMUSCULAR | Status: DC | PRN
  Administered 2015-06-09: 8 mg via INTRAVENOUS

## 2015-06-09 MED ORDER — OXYCODONE HCL 5 MG PO TABS
ORAL_TABLET | ORAL | Status: AC
  Filled 2015-06-09: qty 1

## 2015-06-09 MED ORDER — FENTANYL CITRATE (PF) 250 MCG/5ML IJ SOLN
INTRAMUSCULAR | Status: AC
  Filled 2015-06-09: qty 5

## 2015-06-09 MED ORDER — OXYCODONE HCL 5 MG PO TABS
5.0000 mg | ORAL_TABLET | ORAL | Status: DC | PRN
  Administered 2015-06-09: 10 mg via ORAL
  Filled 2015-06-09: qty 1

## 2015-06-09 MED ORDER — LIDOCAINE HCL (CARDIAC) 20 MG/ML IV SOLN
INTRAVENOUS | Status: DC | PRN
  Administered 2015-06-09: 50 mg via INTRAVENOUS

## 2015-06-09 MED ORDER — ONDANSETRON HCL 4 MG/2ML IJ SOLN
INTRAMUSCULAR | Status: DC | PRN
  Administered 2015-06-09: 4 mg via INTRAVENOUS

## 2015-06-09 MED ORDER — PROPOFOL 10 MG/ML IV BOLUS
INTRAVENOUS | Status: DC | PRN
  Administered 2015-06-09: 160 mg via INTRAVENOUS

## 2015-06-09 MED ORDER — PROPOFOL 10 MG/ML IV BOLUS
INTRAVENOUS | Status: AC
  Filled 2015-06-09: qty 20

## 2015-06-09 MED ORDER — TAMSULOSIN HCL 0.4 MG PO CAPS: 0.4000 mg | ORAL_CAPSULE | Freq: Every day | ORAL | Status: DC

## 2015-06-09 MED ORDER — FENTANYL CITRATE (PF) 100 MCG/2ML IJ SOLN
25.0000 ug | INTRAMUSCULAR | Status: DC | PRN
  Administered 2015-06-09: 50 ug via INTRAVENOUS

## 2015-06-09 MED ORDER — SODIUM CHLORIDE 0.9 % IJ SOLN: 3.0000 mL | Freq: Two times a day (BID) | INTRAMUSCULAR | Status: DC

## 2015-06-09 MED ORDER — STERILE WATER FOR IRRIGATION IR SOLN
Status: DC | PRN
  Administered 2015-06-09: 1000 mL

## 2015-06-09 MED ORDER — ACETAMINOPHEN 650 MG RE SUPP
650.0000 mg | RECTAL | Status: DC | PRN
  Filled 2015-06-09: qty 1

## 2015-06-09 MED ORDER — MIDAZOLAM HCL 5 MG/5ML IJ SOLN
INTRAMUSCULAR | Status: DC | PRN
  Administered 2015-06-09: 2 mg via INTRAVENOUS

## 2015-06-09 MED ORDER — SODIUM CHLORIDE 0.9 % IR SOLN
Status: DC | PRN
  Administered 2015-06-09: 3000 mL

## 2015-06-09 MED ORDER — ACETAMINOPHEN 325 MG PO TABS: 650.0000 mg | ORAL_TABLET | ORAL | Status: DC | PRN

## 2015-06-09 MED ORDER — SODIUM CHLORIDE 0.9 % IJ SOLN: 3.0000 mL | INTRAMUSCULAR | Status: DC | PRN

## 2015-06-09 MED ORDER — PHENYLEPHRINE HCL 10 MG/ML IJ SOLN
INTRAMUSCULAR | Status: DC | PRN
  Administered 2015-06-09: 80 ug via INTRAVENOUS

## 2015-06-09 MED ORDER — MIDAZOLAM HCL 2 MG/2ML IJ SOLN
INTRAMUSCULAR | Status: AC
  Filled 2015-06-09: qty 2

## 2015-06-09 MED ORDER — OXYCODONE-ACETAMINOPHEN 5-325 MG PO TABS: 1.0000 | ORAL_TABLET | ORAL | Status: AC | PRN

## 2015-06-09 MED ORDER — LACTATED RINGERS IV SOLN
INTRAVENOUS | Status: DC
  Administered 2015-06-09 (×2): via INTRAVENOUS

## 2015-06-09 MED ORDER — GLYCOPYRROLATE 0.2 MG/ML IJ SOLN
INTRAMUSCULAR | Status: AC
  Filled 2015-06-09: qty 1

## 2015-06-09 MED ORDER — SODIUM CHLORIDE 0.9 % IV SOLN: 250.0000 mL | INTRAVENOUS | Status: DC | PRN

## 2015-06-09 MED ORDER — FENTANYL CITRATE (PF) 100 MCG/2ML IJ SOLN
INTRAMUSCULAR | Status: DC | PRN
  Administered 2015-06-09 (×2): 50 ug via INTRAVENOUS

## 2015-06-09 MED ORDER — CEFAZOLIN SODIUM-DEXTROSE 2-3 GM-% IV SOLR
2.0000 g | INTRAVENOUS | Status: AC
  Administered 2015-06-09: 2 g via INTRAVENOUS
  Filled 2015-06-09: qty 50

## 2015-06-09 MED ORDER — DEXAMETHASONE SODIUM PHOSPHATE 4 MG/ML IJ SOLN
INTRAMUSCULAR | Status: AC
  Filled 2015-06-09: qty 2

## 2015-06-09 MED ORDER — FENTANYL CITRATE (PF) 100 MCG/2ML IJ SOLN
INTRAMUSCULAR | Status: AC
  Filled 2015-06-09: qty 2

## 2015-06-09 SURGICAL SUPPLY — 20 items
BAG DRAIN URO TABLE W/ADPT NS (DRAPE) ×4 IMPLANT
BAG HAMPER (MISCELLANEOUS) ×4 IMPLANT
BASKET STONE 1.9X4X120X20 (MISCELLANEOUS) ×4 IMPLANT
CATH URET 5FR 28IN OPEN ENDED (CATHETERS) ×4 IMPLANT
CLOTH BEACON ORANGE TIMEOUT ST (SAFETY) ×8 IMPLANT
GLOVE BIOGEL PI IND STRL 7.0 (GLOVE) ×4 IMPLANT
GLOVE BIOGEL PI INDICATOR 7.0 (GLOVE) ×4
GLOVE ECLIPSE 6.5 STRL STRAW (GLOVE) ×4 IMPLANT
GLOVE SURG SS PI 8.0 STRL IVOR (GLOVE) ×4 IMPLANT
GOWN STRL REUS W/TWL LRG LVL3 (GOWN DISPOSABLE) ×4 IMPLANT
GOWN STRL REUS W/TWL XL LVL3 (GOWN DISPOSABLE) ×4 IMPLANT
GUIDEWIRE STR DUAL SENSOR (WIRE) ×4 IMPLANT
IV NS IRRIG 3000ML ARTHROMATIC (IV SOLUTION) ×8 IMPLANT
KIT ROOM TURNOVER AP CYSTO (KITS) ×4 IMPLANT
MANIFOLD NEPTUNE II (INSTRUMENTS) ×4 IMPLANT
PACK CYSTO (CUSTOM PROCEDURE TRAY) ×4 IMPLANT
PAD ARMBOARD 7.5X6 YLW CONV (MISCELLANEOUS) ×4 IMPLANT
SHEATH ACCESS URETERAL 38CM (SHEATH) ×4 IMPLANT
TOWEL OR 17X26 4PK STRL BLUE (TOWEL DISPOSABLE) ×8 IMPLANT
WATER STERILE IRR 1000ML POUR (IV SOLUTION) ×4 IMPLANT

## 2015-06-09 NOTE — H&P (Signed)
Active Problems Problems  1. Calculus of left ureter (N20.1) 2. Nephrolithiasis (N20.0)  History of Present Illness James Hays is a 64 yo WM who was sent in consultation by Dr. Dennard Schaumann for dysuria.. He had back surgery in July that was complicated with infection. He had the surgery for a HNP at L3-4 that occurred following a chiropractic adjustment. He had none of the current symptoms from the HNP. He had a prolonged course and just came off of antibiotics in October. He has had multiple images and had 2 small right renal stones. He has a history of stones and had ESWL in 2011.  He has chronic penile burning since coming off of the antibiotics. The burning is up to the bladder. The pain doesn't lateralize. It is not worse during voiding. He has some urgency with the pain and some frequency. He has nocturia x 1 and that is from the dog waking him up. He has no hesitancy and has a good stream. He has had no hematuria. He has been tested for STI that was negative and he has had negative UA's. He didn't have a catheter during his hospital stay.   Past Medical History Problems  1. History of Herniated nucleus pulposus (M51.9) 2. History of arthritis (Z87.39) 3. History of hypercholesterolemia (Z86.39) 4. History of renal calculi (Z87.442) 5. History of Post-operative infection (T81.4XXA)  Surgical History Problems  1. History of Back Surgery 2. History of Inguinal Hernia Repair 3. History of Renal Lithotripsy 4. History of Umbilical Hernia Repair  Current Meds 1. Aspirin 81 MG TABS;  Therapy: (Recorded:09Dec2016) to Recorded 2. Vitamin D3 TABS;  Therapy: (Recorded:09Dec2016) to Recorded  Allergies Medication  1. No Known Drug Allergies  Family History Problems  1. Family history of Deceased : Father 2. Family history of cardiac disorder (Z82.49) : Father 3. Family history of malignant neoplasm of prostate [id number] : Father  Social History Problems    Caffeine use (F15.90)    Married   Number of children   Occupation   Smokes cigarettes (F17.210)  Review of Systems Genitourinary, constitutional, skin, eye, otolaryngeal, hematologic/lymphatic, cardiovascular, pulmonary, endocrine, musculoskeletal, gastrointestinal, neurological and psychiatric system(s) were reviewed and pertinent findings if present are noted and are otherwise negative.  Genitourinary: urinary frequency, dysuria, nocturia and penile pain.  Constitutional: feeling tired (fatigue).  ENT: sinus problems.  Musculoskeletal: back pain and joint pain.  Neurological: headache.    Vitals Vital Signs [Data Includes: Last 1 Day]  Recorded: [id number] 09:02AM  Height: 5 ft 8 in Weight: 157 lb  BMI Calculated: 23.87 BSA Calculated: 1.84 Blood Pressure: 157 / 93 Temperature: 97.9 F Heart Rate: 64  Physical Exam Constitutional: Well nourished and well developed . No acute distress.  ENT:. The ears and nose are normal in appearance.  Neck: The appearance of the neck is normal and no neck mass is present.  Pulmonary: No respiratory distress and normal respiratory rhythm and effort.  Cardiovascular: Heart rate and rhythm are normal . No peripheral edema.  Abdomen: The abdomen is soft and nontender. No masses are palpated. No CVA tenderness. No hernias are palpable. No hepatosplenomegaly noted.  Rectal: Rectal exam demonstrates normal sphincter tone, no tenderness and no masses. Estimated prostate size is 2+. The prostate has no nodularity and is not tender. The left seminal vesicle is nonpalpable. The right seminal vesicle is nonpalpable. The perineum is normal on inspection.  Genitourinary: Examination of the penis demonstrates no discharge, no masses, no lesions and a normal meatus. The penis  is circumcised. The scrotum is without lesions. Examination of the right scrotum demonstrates a hydrocele. Examination of the left scrotum demostrates a hydrocele. The right epididymis is palpably normal and  non-tender. The left epididymis is palpably normal and non-tender. The right testis is non-tender and without masses. The left testis is non-tender and without masses.  Lymphatics: The supraclavicular, axillary, femoral and inguinal nodes are not enlarged or tender.  Skin: Normal skin turgor, no visible rash and no visible skin lesions.  Neuro/Psych:. Mood and affect are appropriate. Decreased sensation of the perineum/perianal region (S3,4,5).    Results/Data Urine [Data Includes: Last 1 Day]   [id number]  COLOR YELLOW   APPEARANCE CLEAR   SPECIFIC GRAVITY <1.005   pH 6.5   GLUCOSE NEGATIVE   BILIRUBIN NEGATIVE   KETONE NEGATIVE   BLOOD TRACE   PROTEIN NEGATIVE   NITRITE NEGATIVE   LEUKOCYTE ESTERASE NEGATIVE   SQUAMOUS EPITHELIAL/HPF 0-5 HPF  WBC NONE SEEN WBC/HPF  RBC 0-2 RBC/HPF  BACTERIA NONE SEEN HPF  CRYSTALS NONE SEEN HPF  CASTS NONE SEEN LPF  Yeast NONE SEEN HPF   Old records or history reviewed: REcords from Dr. [name] and Dr. Dennard Schaumann reviewed.  The following images/tracing/specimen were independently visualized:  CT from September reviewed. He has a small right renal stone and I think he has a small LUVJ stone on that study. CT today shows a persistent small LUVJ stone and stable right renal stone.  The following clinical lab reports were reviewed:  UA reviewed.    Assessment Assessed  1. Calculus of left ureter (N20.1) 2. Nephrolithiasis (N20.0)  He has a symptomatic left UVJ stone that has been present since September.   Plan  Nephrolithiasis  1. UA With REFLEX; [Do Not Release]; Status:Hold For - [employer];  Requested for:09Dec2016;   I am going to set him up for left ureteroscopic stone extraction with possible stent hopefully later today.  I have reviewed the risks of bleeding, infection, ureteral injury, need for a stent or secondary procedures, thrombotic events and anesthetic complications.      CT-ABD/PELVIS W/O CONTRAST; Status:Hold  For - Date of Service; Requested for:09Dec2016;   Perform:[place]; Order Comments:No pre-cert needed. Call ref [id number]; Due:11Dec2016; Marked Important; Last Updated [id number], Bryson Ha; 06/09/2015 11:12:22 AM;Ordered; Today;   [id number]; Ordered [id number], Deonne Rooks;   Discussion/Summary CC: Dr. Margaretmary Eddy, Dr. Michel Bickers and Dr. [name].

## 2015-06-09 NOTE — Transfer of Care (Signed)
Immediate Anesthesia Transfer of Care Note  Patient: James Hays  Procedure(s) Performed: Procedure(s): CYSTOSCOPY WITH URETEROSCOPY (Left) STONE EXTRACTION WITH BASKET (N/A)  Patient Location: PACU  Anesthesia Type:General  Level of Consciousness: awake, oriented and patient cooperative  Airway & Oxygen Therapy: Patient Spontanous Breathing and Patient connected to face mask oxygen  Post-op Assessment: Report given to RN and Post -op Vital signs reviewed and stable  Post vital signs: Reviewed and stable  Last Vitals:  Filed Vitals:   06/09/15 1620 06/09/15 1625  BP: 138/91 136/90  Pulse:    Temp:    Resp: 12 16    Complications: No apparent anesthesia complications

## 2015-06-09 NOTE — Anesthesia Preprocedure Evaluation (Addendum)
Anesthesia Evaluation  Patient identified by MRN, date of birth, ID band  Reviewed: Allergy & Precautions, H&P , NPO status , Patient's Chart, lab work & pertinent test results  History of Anesthesia Complications Negative for: history of anesthetic complications  Airway Mallampati: II  TM Distance: >3 FB Neck ROM: full    Dental  (+) Edentulous Upper, Edentulous Lower   Pulmonary Current Smoker,    Pulmonary exam normal breath sounds clear to auscultation       Cardiovascular  Rhythm:regular Rate:Normal     Neuro/Psych    GI/Hepatic Denies reflux; Last liquid coffee at 8am this morning; Solids last night at 8pm   Endo/Other    Renal/GU      Musculoskeletal   Abdominal   Peds  Hematology negative hematology ROS (+)   Anesthesia Other Findings   Reproductive/Obstetrics                           Anesthesia Physical Anesthesia Plan  ASA: II and emergent  Anesthesia Plan: General LMA   Post-op Pain Management:    Induction: Intravenous  Airway Management Planned: LMA  Additional Equipment:   Intra-op Plan:   Post-operative Plan: Extubation in OR  Informed Consent: I have reviewed the patients History and Physical, chart, labs and discussed the procedure including the risks, benefits and alternatives for the proposed anesthesia with the patient or authorized representative who has indicated his/her understanding and acceptance.   Dental Advisory Given  Plan Discussed with: Surgeon and Anesthesiologist  Anesthesia Plan Comments:       Anesthesia Quick Evaluation

## 2015-06-09 NOTE — Anesthesia Postprocedure Evaluation (Signed)
Anesthesia Post Note  Patient: James Hays  Procedure(s) Performed: Procedure(s) (LRB): CYSTOSCOPY WITH URETEROSCOPY (Left) STONE EXTRACTION WITH BASKET (N/A)  Patient location during evaluation: PACU Anesthesia Type: General Level of consciousness: awake, awake and alert and oriented Pain management: pain level controlled Vital Signs Assessment: post-procedure vital signs reviewed and stable Respiratory status: respiratory function stable Cardiovascular status: stable Postop Assessment: no signs of nausea or vomiting Anesthetic complications: no    Last Vitals:  Filed Vitals:   06/09/15 1625 06/09/15 1718  BP: 136/90 130/93  Pulse:    Temp:  36.4 C  Resp: 16 14    Last Pain:  Filed Vitals:   06/09/15 1721  PainSc: 0-No pain                 ADAMS, AMY A

## 2015-06-09 NOTE — Discharge Instructions (Signed)
Dietary Guidelines to Help Prevent Kidney Stones Your risk of kidney stones can be decreased by adjusting the foods you eat. The most important thing you can do is drink enough fluid. You should drink enough fluid to keep your urine clear or pale yellow. The following guidelines provide specific information for the type of kidney stone you have had. GUIDELINES ACCORDING TO TYPE OF KIDNEY STONE Calcium Oxalate Kidney Stones  Reduce the amount of salt you eat. Foods that have a lot of salt cause your body to release excess calcium into your urine. The excess calcium can combine with a substance called oxalate to form kidney stones.  Reduce the amount of animal protein you eat if the amount you eat is excessive. Animal protein causes your body to release excess calcium into your urine. Ask your dietitian how much protein from animal sources you should be eating.  Avoid foods that are high in oxalates. If you take vitamins, they should have less than 500 mg of vitamin C. Your body turns vitamin C into oxalates. You do not need to avoid fruits and vegetables high in vitamin C. Calcium Phosphate Kidney Stones  Reduce the amount of salt you eat to help prevent the release of excess calcium into your urine.  Reduce the amount of animal protein you eat if the amount you eat is excessive. Animal protein causes your body to release excess calcium into your urine. Ask your dietitian how much protein from animal sources you should be eating.  Get enough calcium from food or take a calcium supplement (ask your dietitian for recommendations). Food sources of calcium that do not increase your risk of kidney stones include:  Broccoli.  Dairy products, such as cheese and yogurt.  Pudding. Uric Acid Kidney Stones  Do not have more than 6 oz of animal protein per day. FOOD SOURCES Animal Protein Sources  Meat (all types).  Poultry.  Eggs.  Fish, seafood. Foods High in [employer] seasonings.  Soy  sauce.  Teriyaki sauce.  Cured and processed meats.  Salted crackers and snack foods.  Fast food.  Canned soups and most canned foods. Foods High in Oxalates  Grains:  Amaranth.  Barley.  Grits.  Wheat germ.  Bran.  Buckwheat flour.  All bran cereals.  Pretzels.  Whole wheat bread.  Vegetables:  Beans (wax).  Beets and beet greens.  Collard greens.  Eggplant.  [place]ole.  Leeks.  Okra.  Parsley.  Rutabagas.  Spinach.  Swiss chard.  Tomato paste.  Fried potatoes.  Sweet potatoes.  Fruits:  Red currants.  Figs.  Kiwi.  Rhubarb.  Meat and Other Protein Sources:  Beans (dried).  Soy burgers and other soybean products.  Miso.  Nuts (peanuts, almonds, pecans, cashews, hazelnuts).  Nut butters.  Sesame seeds and tahini (paste made of sesame seeds).  Poppy seeds.  Beverages:  Chocolate drink mixes.  Soy milk.  Instant iced tea.  Juices made from high-oxalate fruits or vegetables.  Other:  Carob.  Chocolate.  Fruitcake.  Marmalades.   This information is not intended to replace advice given to you by your health care provider. Make sure you discuss any questions you have with your health care provider.   Document Released: 10/12/2010 Document Revised: 06/22/2013 Document Reviewed: 05/14/2013 Elsevier Interactive Patient Education 2016 [employer] CARE INSTRUCTIONS  Activity: Rest for the remainder of the day.  Do not drive or operate equipment today.  You may resume normal activities in one to two  days as instructed by your physician.   Meals: Drink plenty of liquids and eat light foods such as gelatin or soup this evening.  You may return to a normal meal plan tomorrow.  Return to Work: You may return to work in one to two days or as instructed by your physician.  Special Instructions / Symptoms: Call your physician if any of these symptoms occur:   -persistent or heavy  bleeding  -bleeding which continues after first few urination  -large blood clots that are difficult to pass  -urine stream diminishes or stops completely  -fever equal to or higher than 101 degrees Farenheit.  -cloudy urine with a strong, foul odor  -severe pain  Females should always wipe from front to back after elimination.  You may feel some burning pain when you urinate.  This should disappear with time.  Applying moist heat to the lower abdomen or a hot tub bath may help relieve the pain. \   Patient Signature:  ________________________________________________________  Nurse's Signature:  ________________________________________________________

## 2015-06-09 NOTE — Anesthesia Procedure Notes (Signed)
Procedure Name: LMA Insertion Date/Time: 06/09/2015 4:44 PM Performed by: Andree Elk, AMY A Pre-anesthesia Checklist: Patient identified, Timeout performed, Emergency Drugs available, Suction available and Patient being monitored Oxygen Delivery Method: Circle system utilized Preoxygenation: Pre-oxygenation with 100% oxygen Intubation Type: IV induction Ventilation: Mask ventilation without difficulty LMA: LMA inserted LMA Size: 5.0 Number of attempts: 1 Placement Confirmation: positive ETCO2 and breath sounds checked- equal and bilateral Tube secured with: Tape Dental Injury: Teeth and Oropharynx as per pre-operative assessment

## 2015-06-09 NOTE — Brief Op Note (Signed)
06/09/2015  5:07 PM  PATIENT:  James Hays  64 y.o. male  PRE-OPERATIVE DIAGNOSIS:  left ureteral calculus  POST-OPERATIVE DIAGNOSIS:  left ureteral calculus  PROCEDURE:  Procedure(s): CYSTOSCOPY WITH URETEROSCOPY (Left) STONE EXTRACTION WITH BASKET (N/A)  SURGEON:  Surgeon(s) and Role:    * Irine Seal, MD - Primary  PHYSICIAN ASSISTANT:   ASSISTANTS: none   ANESTHESIA:   general  EBL:  Total I/O In: 1000 [I.V.:1000] Out: -   BLOOD ADMINISTERED:none  DRAINS: none   LOCAL MEDICATIONS USED:  NONE  SPECIMEN:  Source of Specimen:  LUVJ stone  DISPOSITION OF SPECIMEN:  PATHOLOGY  COUNTS:  YES  TOURNIQUET:  * No tourniquets in log *  DICTATION: .Other Dictation: Dictation Number [id number]  PLAN OF CARE: Discharge to home after PACU  PATIENT DISPOSITION:  PACU - hemodynamically stable.   Delay start of Pharmacological VTE agent (>24hrs) due to surgical blood loss or risk of bleeding: not applicable

## 2015-06-10 NOTE — Op Note (Signed)
NAME:  James Hays, PLANE NO.:  [id number]  MEDICAL RECORD NO.:  [id number]  LOCATION:  [facility]                          FACILITY:  [facility]  PHYSICIAN:  Marshall Cork. Jeffie Pollock, M.D.    DATE OF BIRTH:  1950-07-04  DATE OF PROCEDURE:  06/09/2015 DATE OF DISCHARGE:                              OPERATIVE REPORT   PROCEDURE:  Cystoscopy with left ureteroscopy and stone extraction.  PREOPERATIVE DIAGNOSIS:  Left UVJ stones.  POSTOPERATIVE DIAGNOSIS:  Left UVJ stones.  SURGEON:  Marshall Cork. Jeffie Pollock, M.D.  ANESTHESIA:  General.  SPECIMEN:  Stone fragments.  DRAINS:  None.  BLOOD LOSS:  Minimal.  COMPLICATIONS:  None.  INDICATIONS:  James Hays is a 64 year old white male, who presented today with penile pain that has been present for about 2 months.  He had a CT scan done back in September, which actually showed a left distal ureteral stone as well as a right renal stone.  I repeated a CT scan today, which revealed a persistent left UVJ stone.  There was also a 2 mm proximal stone on the right along with a small renal stone on the right.  After reviewing the options, we have elected to do ureteroscopy for the left UVJ stone and I gave him tamsulosin for the right proximal stone as he did not want to undergo bilateral procedure today.  FINDINGS OF PROCEDURE:  He was given Ancef.  He was taken to the operating room where general anesthetic was induced.  He was placed in lithotomy position.  His perineum and genitalia were prepped with Betadine solution.  He was draped in usual sterile fashion.  Cystoscopy was performed using a 23-French scope and 30-degree lens. Examination revealed a normal urethra.  The external sphincter was intact.  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  Examination of bladder revealed mild trabeculation.  No tumors, stones, or inflammation were noted.  Ureteral orifices were unremarkable.  A guidewire was  then passed up the left ureteral orifice and the cystoscope was removed.  A 38 cm digital access sheath inner core was then passed over the wire to the mid ureter.  I could feel some grading this from the stones, but the dilation was easy.  The dilator was then removed and a 6.5-French semi-rigid ureteroscope was then passed alongside the wire.  He had some blood clot in the distal ureter, but above the clot there was a small stone and this was grasped with a basket and removed.  I then reinspected the ureter and there were 2 other small stones that were within the blood clot, these were retrieved as well.  A final pass revealed no significant residual stones and no active bleeding or need for stent placement.  Ureteroscope was removed.  The guidewire was removed.  The cystoscope was used to drain the bladder.  He was taken down from lithotomy position.  His anesthetic was reversed.  He was moved to recovery in stable condition.  There were no complications.     Marshall Cork. Jeffie Pollock, M.D.     JJW/MEDQ  D:  06/09/2015  T:  06/10/2015  Job:  [id number]

## 2015-06-12 ENCOUNTER — Encounter (HOSPITAL_COMMUNITY): Payer: Self-pay | Admitting: Urology

## 2015-06-15 ENCOUNTER — Encounter (HOSPITAL_COMMUNITY): Payer: Self-pay | Admitting: Urology

## 2015-06-15 LAB — STONE ANALYSIS
CA OXALATE, MONOHYDR.: 10 %
Ca Oxalate,Dihydrate: 50 %
Ca phos cry stone ql IR: 40 %
STONE WEIGHT KSTONE: 10 mg

## 2015-06-20 ENCOUNTER — Encounter: Payer: Self-pay | Admitting: Internal Medicine

## 2015-06-20 ENCOUNTER — Ambulatory Visit (INDEPENDENT_AMBULATORY_CARE_PROVIDER_SITE_OTHER): Payer: BLUE CROSS/BLUE SHIELD | Admitting: Internal Medicine

## 2015-06-20 DIAGNOSIS — G061 Intraspinal abscess and granuloma: Secondary | ICD-10-CM | POA: Diagnosis not present

## 2015-06-20 LAB — C-REACTIVE PROTEIN

## 2015-06-20 NOTE — Progress Notes (Addendum)
[facility] for Infectious Disease  Patient Active Problem List   Diagnosis Date Noted  . Abscess in epidural space of lumbar spine 02/15/2015    Priority: High  . Staphylococcus aureus bacteremia     Priority: High  . Dysuria 05/09/2015  . Chest pain   . HLD (hyperlipidemia)   . Other constipation   . Other postoperative complication involving nervous system   . Surgery, other elective   . S/P diskectomy   . Post-operative complication [id number]  . Postoperative CSF leak 02/10/2015    Patient's Medications  New Prescriptions   No medications on file  Previous Medications   ASPIRIN 81 MG TABLET    Take 81 mg by mouth daily.   CHOLECALCIFEROL (VITAMIN D) 400 UNITS TABS TABLET    Take 400 Units by mouth.   IBUPROFEN (ADVIL,MOTRIN) 200 MG TABLET    Take 200 mg by mouth every 6 (six) hours as needed.   OXYCODONE-ACETAMINOPHEN (ROXICET) 5-325 MG TABLET    Take 1 tablet by mouth every 4 (four) hours as needed for severe pain.   TAMSULOSIN (FLOMAX) 0.4 MG CAPS CAPSULE    Take 1 capsule (0.4 mg total) by mouth daily.  Modified Medications   No medications on file  Discontinued Medications   ALBUTEROL SULFATE (PROAIR RESPICLICK) 123XX123 (90 BASE) MCG/ACT AEPB    Inhale 2 puffs into the lungs every 4 (four) hours as needed.    Subjective: James Hays is in for his routine follow-up visit. He completed 10 weeks of antibiotic therapy on 05/09/2015 for his MSSA bacteremia and lumbar abscess. He is feeling better. He is having less low back pain and requiring less pain medication. He recalls taking only 1 Percocet since his last visit. He will occasionally take some ibuprofen. He has resumed all of his usual activities. He recently had 3 ureteral stones extracted.  Review of Systems: Review of Systems  Constitutional: Negative for fever, chills, weight loss, malaise/fatigue and diaphoresis.  HENT: Negative for sore throat.   Respiratory: Negative for cough, sputum production  and shortness of breath.   Cardiovascular: Negative for chest pain.  Gastrointestinal: Negative for nausea, vomiting and diarrhea.  Genitourinary: Negative for dysuria and frequency.  Musculoskeletal: Positive for back pain. Negative for joint pain.  Skin: Negative for rash.  Neurological: Negative for focal weakness.  Psychiatric/Behavioral: Negative for depression and substance abuse. The patient is not nervous/anxious.     Past Medical History  Diagnosis Date  . Hyperlipidemia   . Hyperparathyroidism ([place])   . Hypercalcemia   . Depression   . Erectile dysfunction     Social History  Substance Use Topics  . Smoking status: Current Every Day Smoker -- 1.00 packs/day    Types: Cigarettes  . Smokeless tobacco: Never Used  . Alcohol Use: No    Family History  Problem Relation Age of Onset  . Hypertension Mother   . Diabetes Mother   . Heart disease Father 79  . Hyperlipidemia Father   . Diabetes Brother     No Known Allergies  Objective: Filed Vitals:   06/20/15 1003  BP: 119/84  Pulse: 87  Temp: 97.7 F (36.5 C)  Height: 5\' 8"  (1.727 m)  Weight: 165 lb (74.844 kg)   Body mass index is 25.09 kg/(m^2).  Physical Exam  Constitutional: He is oriented to person, place, and time.  He is smiling and in good spirits.  Cardiovascular: Normal rate and regular rhythm.  No murmur heard. Pulmonary/Chest: Breath sounds normal.  Musculoskeletal:  Lumbar incision is fully healed without evidence of infection.  Neurological: He is oriented to person, place, and time. Gait normal.  Psychiatric: Mood and affect normal.    Lab Results SED RATE (mm/hr)  Date Value  06/20/2015 42*  05/09/2015 32*  03/28/2015 47*   CRP (mg/dL)  Date Value  06/20/2015 <0.5  05/09/2015 <0.5  03/28/2015 <0.5     Problem List Items Addressed This Visit      High   Abscess in epidural space of lumbar spine    I'm very hopeful that his lumbar infection has been cured. I will  continue observation off of antibiotics and repeat his inflammatory markers today.      Relevant Orders   Sedimentation rate   C-reactive protein (Completed)   C-reactive protein   Sedimentation rate (Completed)       Michel Bickers, MD [facility] for [facility] [place] [phone] pager   [phone] cell  Addendum:  His sedimentation rate remained slightly elevated but his C-reactive protein is normal and he continues to show clinical improvement 6 weeks after stopping antibiotic therapy. I talked him about this is morning and still feel perfectly comfortable observing off of antibiotic therapy. He will follow-up with me in 4-6 weeks.  Michel Bickers, MD [facility] for [facility] [phone] pager   [phone] cell 06/21/2015, 10:28 AM 06/21/2015, 10:26 AM

## 2015-06-20 NOTE — Assessment & Plan Note (Signed)
I'm very hopeful that his lumbar infection has been cured. I will continue observation off of antibiotics and repeat his inflammatory markers today.

## 2015-06-21 LAB — SEDIMENTATION RATE: SED RATE: 42 mm/h — AB (ref 0–20)

## 2015-06-30 ENCOUNTER — Ambulatory Visit (INDEPENDENT_AMBULATORY_CARE_PROVIDER_SITE_OTHER): Payer: BLUE CROSS/BLUE SHIELD | Admitting: Urology

## 2015-06-30 DIAGNOSIS — N201 Calculus of ureter: Secondary | ICD-10-CM | POA: Diagnosis not present

## 2015-06-30 DIAGNOSIS — N200 Calculus of kidney: Secondary | ICD-10-CM | POA: Diagnosis not present

## 2015-07-05 ENCOUNTER — Encounter: Payer: Self-pay | Admitting: Internal Medicine

## 2015-08-30 ENCOUNTER — Other Ambulatory Visit (HOSPITAL_COMMUNITY): Payer: Self-pay | Admitting: Surgery

## 2015-08-30 DIAGNOSIS — E210 Primary hyperparathyroidism: Secondary | ICD-10-CM

## 2015-09-01 ENCOUNTER — Encounter: Payer: Self-pay | Admitting: Family Medicine

## 2015-09-01 DIAGNOSIS — E210 Primary hyperparathyroidism: Secondary | ICD-10-CM | POA: Insufficient documentation

## 2015-09-13 ENCOUNTER — Encounter (HOSPITAL_COMMUNITY)
Admission: RE | Admit: 2015-09-13 | Discharge: 2015-09-13 | Disposition: A | Discharge status: Home / Self Care | Payer: BLUE CROSS/BLUE SHIELD | Source: Ambulatory Visit | Attending: Surgery | Admitting: Surgery

## 2015-09-13 DIAGNOSIS — E210 Primary hyperparathyroidism: Secondary | ICD-10-CM

## 2015-09-13 MED ORDER — TECHNETIUM TC 99M SESTAMIBI - CARDIOLITE
26.3000 | Freq: Once | INTRAVENOUS | Status: AC | PRN
  Administered 2015-09-13: 11:00:00 26 via INTRAVENOUS

## 2015-10-05 ENCOUNTER — Ambulatory Visit: Payer: Self-pay | Admitting: Surgery

## 2015-10-23 NOTE — Patient Instructions (Signed)
James Hays  10/23/2015   Your procedure is scheduled on: 11/03/2015    Report to [facility] Main  Entrance take [place]  elevators to 3rd floor to  [place] at    0830 AM.  Call this number if you have problems the morning of surgery [phone]   Remember: ONLY 1 PERSON MAY GO WITH YOU TO SHORT STAY TO GET  READY MORNING OF [place].  Do not eat food or drink liquids :After Midnight.     Take these medicines the morning of surgery with A SIP OF WATER: nasascort if needed, Oxycodone if needed                                 You may not have any metal on your body including hair pins and              piercings  Do not wear jewelry,  lotions, powders or perfumes, deodorant                       Men may shave face and neck.   Do not bring valuables to the hospital. [place].  Contacts, dentures or bridgework may not be worn into surgery.      Patients discharged the day of surgery will not be allowed to drive home.  Name and phone number of your driver:  Special Instructions: coughing and deep breathing exercises, leg exercises               Please read over the following fact sheets you were given: _____________________________________________________________________             [facility] - Preparing for Surgery Before surgery, you can play an important role.  Because skin is not sterile, your skin needs to be as free of germs as possible.  You can reduce the number of germs on your skin by washing with CHG (chlorahexidine gluconate) soap before surgery.  CHG is an antiseptic cleaner which kills germs and bonds with the skin to continue killing germs even after washing. Please DO NOT use if you have an allergy to CHG or antibacterial soaps.  If your skin becomes reddened/irritated stop using the CHG and inform your nurse when you arrive at Short Stay. Do not shave (including legs and  underarms) for at least 48 hours prior to the first CHG shower.  You may shave your face/neck. Please follow these instructions carefully:  1.  Shower with CHG Soap the night before surgery and the  morning of Surgery.  2.  If you choose to wash your hair, wash your hair first as usual with your  normal  shampoo.  3.  After you shampoo, rinse your hair and body thoroughly to remove the  shampoo.                           4.  Use CHG as you would any other liquid soap.  You can apply chg directly  to the skin and wash                       Gently with a scrungie  or clean washcloth.  5.  Apply the CHG Soap to your body ONLY FROM THE NECK DOWN.   Do not use on face/ open                           Wound or open sores. Avoid contact with eyes, ears mouth and genitals (private parts).                       Wash face,  Genitals (private parts) with your normal soap.             6.  Wash thoroughly, paying special attention to the area where your surgery  will be performed.  7.  Thoroughly rinse your body with warm water from the neck down.  8.  DO NOT shower/wash with your normal soap after using and rinsing off  the CHG Soap.                9.  Pat yourself dry with a clean towel.            10.  Wear clean pajamas.            11.  Place clean sheets on your bed the night of your first shower and do not  sleep with pets. Day of Surgery : Do not apply any lotions/deodorants the morning of surgery.  Please wear clean clothes to the hospital/surgery center.  FAILURE TO FOLLOW THESE INSTRUCTIONS MAY RESULT IN THE CANCELLATION OF YOUR SURGERY PATIENT SIGNATURE_________________________________  NURSE SIGNATURE__________________________________  ________________________________________________________________________

## 2015-10-25 ENCOUNTER — Encounter (HOSPITAL_COMMUNITY): Payer: Self-pay

## 2015-10-25 ENCOUNTER — Ambulatory Visit (HOSPITAL_COMMUNITY)
Admission: RE | Admit: 2015-10-25 | Discharge: 2015-10-25 | Disposition: A | Discharge status: Home / Self Care | Payer: BLUE CROSS/BLUE SHIELD | Source: Ambulatory Visit | Attending: Anesthesiology | Admitting: Anesthesiology

## 2015-10-25 ENCOUNTER — Encounter (HOSPITAL_COMMUNITY)
Admission: RE | Admit: 2015-10-25 | Discharge: 2015-10-25 | Disposition: A | Discharge status: Home / Self Care | Payer: BLUE CROSS/BLUE SHIELD | Source: Ambulatory Visit | Attending: Surgery | Admitting: Surgery

## 2015-10-25 DIAGNOSIS — Z01818 Encounter for other preprocedural examination: Secondary | ICD-10-CM | POA: Insufficient documentation

## 2015-10-25 HISTORY — DX: Unspecified osteoarthritis, unspecified site: M19.90

## 2015-10-25 HISTORY — DX: Calculus of kidney: N20.0

## 2015-10-25 LAB — CBC
HEMATOCRIT: 47.4 % (ref 39.0–52.0)
HEMOGLOBIN: 16.2 g/dL (ref 13.0–17.0)
MCH: 31.6 pg (ref 26.0–34.0)
MCHC: 34.2 g/dL (ref 30.0–36.0)
MCV: 92.6 fL (ref 78.0–100.0)
PLATELETS: 263 10*3/uL (ref 150–400)
RBC: 5.12 MIL/uL (ref 4.22–5.81)
RDW: 13.2 % (ref 11.5–15.5)
WBC: 8.1 10*3/uL (ref 4.0–10.5)

## 2015-10-25 NOTE — Progress Notes (Signed)
EKG- 02/13/2015- EP[facility]  01/2015- ECHO- EPIC  03/14/15- CT chest- EPIC

## 2015-11-02 ENCOUNTER — Encounter (HOSPITAL_COMMUNITY): Payer: Self-pay | Admitting: Surgery

## 2015-11-02 NOTE — H&P (Signed)
General Surgery [facility]  Eulogio Ditch. Rockman DOB: 1950/09/21 Married / Language: English / Race: White Male  History of Present Illness  Patient words: hyperparathyroidism.  The patient is a 65 year old male who presents with a parathyroid neoplasm.  Patient referred by Dr. Irine Seal for evaluation of suspected primary hyperparathyroidism. Patient's primary care physician is Dr. Jenna Luo. Patient has a long-standing history of nephrolithiasis dating back approximately 30 years. Patient was noted several years ago to have hypercalcemia. He was living in [place] at that time. Workup was initiated but no radiographic imaging was performed and certainly the patient has never had surgery for parathyroid disease. Patient denies any other signs or symptoms than nephrolithiasis. He denies chronic fatigue. He has had no fractures. Patient has had no prior head or neck surgery. There is no family history of endocrine neoplasms.   Other Problems Arthritis Back Pain Inguinal Hernia Kidney Stone  Past Surgical History Open Inguinal Hernia Surgery Right. Spinal Surgery - Lower Back Vasectomy Ventral / Umbilical Hernia Surgery Right.  Diagnostic Studies History Colonoscopy never  Allergies  No Known Allergies02/28/2017  Medication History Aspirin (81MG  Tablet DR, Oral) Active. Ibuprofen (200MG  Tablet, Oral as needed) Active. Medications Reconciled  Social History  Caffeine use Carbonated beverages. No drug use Tobacco use Current every day smoker.  Family History Colon Cancer Father.  Review of Systems General Not Present- Appetite Loss, Chills, Fatigue, Fever, Night Sweats, Weight Gain and Weight Loss. Skin Present- Rash. Not Present- Change in Wart/Mole, Dryness, Hives, Jaundice, New Lesions, Non-Healing Wounds and Ulcer. HEENT Present- Sinus Pain and Wears glasses/contact lenses. Not Present- Earache, Hearing Loss,  Hoarseness, Nose Bleed, Oral Ulcers, Ringing in the Ears, Seasonal Allergies, Sore Throat, Visual Disturbances and Yellow Eyes. Male Genitourinary Not Present- Blood in Urine, Change in Urinary Stream, Frequency, Impotence, Nocturia, Painful Urination, Urgency and Urine Leakage. Musculoskeletal Present- Back Pain, Joint Pain and Joint Stiffness. Not Present- Muscle Pain, Muscle Weakness and Swelling of Extremities. Neurological Present- Headaches and Numbness. Not Present- Decreased Memory, Fainting, Seizures, Tingling, Tremor, Trouble walking and Weakness. Hematology Not Present- Easy Bruising, Excessive bleeding, Gland problems, HIV and Persistent Infections.  Vitals  Weight: 170 lb Height: 68in Body Surface Area: 1.91 m Body Mass Index: 25.85 kg/m  Temp.: 98.53F(Oral)  Pulse: 77 (Regular)  BP: 122/78 (Sitting, Left Arm, Standard)  Physical Exam  General - appears comfortable, no distress; not diaphorectic  HEENT - normocephalic; sclerae clear, gaze conjugate; mucous membranes moist, dentition good; voice normal  Neck - symmetric on extension; no palpable anterior or posterior cervical adenopathy; no palpable masses in the thyroid bed  Chest - clear bilaterally without rhonchi, rales, or wheeze  Cor - regular rhythm with normal rate; no significant murmur  Ext - non-tender without significant edema or lymphedema  Neuro - grossly intact; no tremor   Assessment & Plan   PRIMARY HYPERPARATHYROIDISM (E21.0)  Pt Education - Pamphlet Given - The Parathyroid Surgery Book: discussed with patient and provided information. Follow Up - Call [facility] office after tests / studies done to discuss further plans  Patient presents on referral from his urologist with hypercalcemia and elevated intact PTH level consistent with primary hyperparathyroidism. Patient is provided with written literature on parathyroid surgery to review at home.  The patient and I reviewed his history dating  back several years to when he lived in [place]. He has not had any previous imaging. I am going to schedule a nuclear medicine parathyroid scan for evaluation.  If this is positive, I believe the patient will be a good candidate for minimally invasive surgery. We discussed minimally invasive surgery to be performed as an outpatient. We discussed the possibility of second gland adenomas. If the nuclear medicine scan is unrevealing, we will consider high resolution ultrasound exam in hopes of localizing the adenoma.  Patient will be scheduled for sestamibi scan. We will contact him with the results when they are available.  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  Will proceed with minimally invasive surgery.  The risks and benefits of the procedure have been discussed at length with the patient.  The patient understands the proposed procedure, potential alternative treatments, and the course of recovery to be expected.  All of the patient's questions have been answered at this time.  The patient wishes to proceed with surgery.  Earnstine Regal, MD, [facility] Office: [phone]

## 2015-11-03 ENCOUNTER — Ambulatory Visit (HOSPITAL_COMMUNITY): Payer: BLUE CROSS/BLUE SHIELD | Admitting: Certified Registered"

## 2015-11-03 ENCOUNTER — Encounter (HOSPITAL_COMMUNITY)
Admission: RE | Disposition: A | Discharge status: Home / Self Care | Payer: Self-pay | Source: Ambulatory Visit | Attending: Surgery

## 2015-11-03 ENCOUNTER — Ambulatory Visit (HOSPITAL_COMMUNITY)
Admission: RE | Admit: 2015-11-03 | Discharge: 2015-11-03 | Disposition: A | Discharge status: Home / Self Care | Payer: BLUE CROSS/BLUE SHIELD | Source: Ambulatory Visit | Attending: Surgery | Admitting: Surgery

## 2015-11-03 ENCOUNTER — Encounter (HOSPITAL_COMMUNITY): Payer: Self-pay | Admitting: *Deleted

## 2015-11-03 DIAGNOSIS — D351 Benign neoplasm of parathyroid gland: Secondary | ICD-10-CM | POA: Diagnosis not present

## 2015-11-03 DIAGNOSIS — E210 Primary hyperparathyroidism: Secondary | ICD-10-CM | POA: Diagnosis present

## 2015-11-03 DIAGNOSIS — Z7982 Long term (current) use of aspirin: Secondary | ICD-10-CM | POA: Diagnosis not present

## 2015-11-03 DIAGNOSIS — Z87442 Personal history of urinary calculi: Secondary | ICD-10-CM | POA: Diagnosis not present

## 2015-11-03 DIAGNOSIS — F17210 Nicotine dependence, cigarettes, uncomplicated: Secondary | ICD-10-CM | POA: Insufficient documentation

## 2015-11-03 HISTORY — PX: PARATHYROIDECTOMY: SHX19

## 2015-11-03 SURGERY — PARATHYROIDECTOMY
Anesthesia: General

## 2015-11-03 MED ORDER — LACTATED RINGERS IV SOLN
INTRAVENOUS | Status: DC
  Administered 2015-11-03: 14:00:00 via INTRAVENOUS
  Administered 2015-11-03: 1000 mL via INTRAVENOUS

## 2015-11-03 MED ORDER — BUPIVACAINE-EPINEPHRINE 0.25% -1:200000 IJ SOLN
INTRAMUSCULAR | Status: AC
  Filled 2015-11-03: qty 1

## 2015-11-03 MED ORDER — DEXAMETHASONE SODIUM PHOSPHATE 10 MG/ML IJ SOLN
INTRAMUSCULAR | Status: DC | PRN
  Administered 2015-11-03: 10 mg via INTRAVENOUS

## 2015-11-03 MED ORDER — MIDAZOLAM HCL 5 MG/5ML IJ SOLN
INTRAMUSCULAR | Status: DC | PRN
  Administered 2015-11-03: 2 mg via INTRAVENOUS

## 2015-11-03 MED ORDER — 0.9 % SODIUM CHLORIDE (POUR BTL) OPTIME
TOPICAL | Status: DC | PRN
  Administered 2015-11-03: 1000 mL

## 2015-11-03 MED ORDER — BUPIVACAINE HCL 0.25 % IJ SOLN
INTRAMUSCULAR | Status: DC | PRN
  Administered 2015-11-03: 6 mL

## 2015-11-03 MED ORDER — ONDANSETRON HCL 4 MG/2ML IJ SOLN
INTRAMUSCULAR | Status: AC
  Filled 2015-11-03: qty 4

## 2015-11-03 MED ORDER — PROPOFOL 10 MG/ML IV BOLUS
INTRAVENOUS | Status: AC
  Filled 2015-11-03: qty 20

## 2015-11-03 MED ORDER — LACTATED RINGERS IV SOLN: INTRAVENOUS | Status: DC

## 2015-11-03 MED ORDER — LIDOCAINE HCL (CARDIAC) 20 MG/ML IV SOLN
INTRAVENOUS | Status: DC | PRN
  Administered 2015-11-03: 80 mg via INTRAVENOUS

## 2015-11-03 MED ORDER — CEFAZOLIN SODIUM-DEXTROSE 2-4 GM/100ML-% IV SOLN
2.0000 g | INTRAVENOUS | Status: AC
Start: 2015-11-03 — End: 2015-11-03
  Administered 2015-11-03: 2 g via INTRAVENOUS
  Filled 2015-11-03: qty 100

## 2015-11-03 MED ORDER — MIDAZOLAM HCL 2 MG/2ML IJ SOLN
INTRAMUSCULAR | Status: AC
  Filled 2015-11-03: qty 2

## 2015-11-03 MED ORDER — PHENYLEPHRINE 40 MCG/ML (10ML) SYRINGE FOR IV PUSH (FOR BLOOD PRESSURE SUPPORT)
PREFILLED_SYRINGE | INTRAVENOUS | Status: AC
  Filled 2015-11-03: qty 20

## 2015-11-03 MED ORDER — MEPERIDINE HCL 50 MG/ML IJ SOLN: 6.2500 mg | INTRAMUSCULAR | Status: DC | PRN

## 2015-11-03 MED ORDER — SUGAMMADEX SODIUM 200 MG/2ML IV SOLN
INTRAVENOUS | Status: DC | PRN
  Administered 2015-11-03: 150 mg via INTRAVENOUS

## 2015-11-03 MED ORDER — PROPOFOL 10 MG/ML IV BOLUS
INTRAVENOUS | Status: DC | PRN
  Administered 2015-11-03: 180 mg via INTRAVENOUS
  Administered 2015-11-03: 30 mg via INTRAVENOUS

## 2015-11-03 MED ORDER — FENTANYL CITRATE (PF) 100 MCG/2ML IJ SOLN
INTRAMUSCULAR | Status: AC
  Filled 2015-11-03: qty 2

## 2015-11-03 MED ORDER — SUGAMMADEX SODIUM 200 MG/2ML IV SOLN
INTRAVENOUS | Status: AC
  Filled 2015-11-03: qty 2

## 2015-11-03 MED ORDER — DEXAMETHASONE SODIUM PHOSPHATE 10 MG/ML IJ SOLN
INTRAMUSCULAR | Status: AC
  Filled 2015-11-03: qty 1

## 2015-11-03 MED ORDER — FENTANYL CITRATE (PF) 100 MCG/2ML IJ SOLN
INTRAMUSCULAR | Status: DC | PRN
  Administered 2015-11-03: 100 ug via INTRAVENOUS

## 2015-11-03 MED ORDER — ONDANSETRON HCL 4 MG/2ML IJ SOLN
INTRAMUSCULAR | Status: DC | PRN
Start: 2015-11-03 — End: 2015-11-03
  Administered 2015-11-03 (×4): 2 mg via INTRAVENOUS

## 2015-11-03 MED ORDER — CEFAZOLIN SODIUM-DEXTROSE 2-4 GM/100ML-% IV SOLN
INTRAVENOUS | Status: AC
  Filled 2015-11-03: qty 100

## 2015-11-03 MED ORDER — HYDROCODONE-ACETAMINOPHEN 5-325 MG PO TABS: 1.0000 | ORAL_TABLET | ORAL | Status: AC | PRN

## 2015-11-03 MED ORDER — ROCURONIUM BROMIDE 50 MG/5ML IV SOLN
INTRAVENOUS | Status: AC
  Filled 2015-11-03: qty 1

## 2015-11-03 MED ORDER — ROCURONIUM BROMIDE 100 MG/10ML IV SOLN
INTRAVENOUS | Status: DC | PRN
  Administered 2015-11-03: 50 mg via INTRAVENOUS

## 2015-11-03 MED ORDER — FENTANYL CITRATE (PF) 100 MCG/2ML IJ SOLN
25.0000 ug | INTRAMUSCULAR | Status: DC | PRN
  Administered 2015-11-03 (×2): 50 ug via INTRAVENOUS

## 2015-11-03 MED ORDER — PHENYLEPHRINE HCL 10 MG/ML IJ SOLN
INTRAMUSCULAR | Status: DC | PRN
  Administered 2015-11-03 (×2): 120 ug via INTRAVENOUS
  Administered 2015-11-03 (×3): 80 ug via INTRAVENOUS
  Administered 2015-11-03: 160 ug via INTRAVENOUS

## 2015-11-03 MED ORDER — METOCLOPRAMIDE HCL 5 MG/ML IJ SOLN
10.0000 mg | Freq: Once | INTRAMUSCULAR | Status: DC | PRN
Start: 2015-11-03 — End: 2015-11-03

## 2015-11-03 MED ORDER — LIDOCAINE HCL (CARDIAC) 20 MG/ML IV SOLN
INTRAVENOUS | Status: AC
  Filled 2015-11-03: qty 5

## 2015-11-03 SURGICAL SUPPLY — 38 items
ATTRACTOMAT 16X20 MAGNETIC DRP (DRAPES) ×2 IMPLANT
BENZOIN TINCTURE PRP APPL 2/3 (GAUZE/BANDAGES/DRESSINGS) ×2 IMPLANT
BLADE HEX COATED 2.75 (ELECTRODE) ×2 IMPLANT
BLADE SURG 15 STRL LF DISP TIS (BLADE) ×1 IMPLANT
BLADE SURG 15 STRL SS (BLADE) ×1
CHLORAPREP W/TINT 26ML (MISCELLANEOUS) ×2 IMPLANT
CLIP TI MEDIUM 6 (CLIP) ×4 IMPLANT
CLIP TI WIDE RED SMALL 6 (CLIP) ×4 IMPLANT
COVER SURGICAL LIGHT HANDLE (MISCELLANEOUS) ×2 IMPLANT
DRAPE LAPAROTOMY T 98X78 PEDS (DRAPES) ×2 IMPLANT
DRESSING SURGICEL FIBRLLR 1X2 (HEMOSTASIS) ×1 IMPLANT
DRSG SURGICEL FIBRILLAR 1X2 (HEMOSTASIS) ×2
ELECT PENCIL ROCKER SW 15FT (MISCELLANEOUS) ×2 IMPLANT
ELECT REM PT RETURN 9FT ADLT (ELECTROSURGICAL) ×2
ELECTRODE REM PT RTRN 9FT ADLT (ELECTROSURGICAL) ×1 IMPLANT
GAUZE SPONGE 4X4 12PLY STRL (GAUZE/BANDAGES/DRESSINGS) ×2 IMPLANT
GAUZE SPONGE 4X4 16PLY XRAY LF (GAUZE/BANDAGES/DRESSINGS) ×2 IMPLANT
GLOVE SURG ORTHO 8.0 STRL STRW (GLOVE) ×2 IMPLANT
GOWN STRL REUS W/TWL XL LVL3 (GOWN DISPOSABLE) ×6 IMPLANT
KIT BASIN OR (CUSTOM PROCEDURE TRAY) ×2 IMPLANT
LIQUID BAND (GAUZE/BANDAGES/DRESSINGS) IMPLANT
NEEDLE HYPO 25X1 1.5 SAFETY (NEEDLE) ×2 IMPLANT
PACK BASIC VI WITH GOWN DISP (CUSTOM PROCEDURE TRAY) ×2 IMPLANT
STAPLER VISISTAT 35W (STAPLE) ×2 IMPLANT
STRIP CLOSURE SKIN 1/2X4 (GAUZE/BANDAGES/DRESSINGS) ×2 IMPLANT
SUT MNCRL AB 4-0 PS2 18 (SUTURE) ×2 IMPLANT
SUT SILK 2 0 (SUTURE)
SUT SILK 2-0 18XBRD TIE 12 (SUTURE) IMPLANT
SUT SILK 3 0 (SUTURE)
SUT SILK 3-0 18XBRD TIE 12 (SUTURE) IMPLANT
SUT VIC AB 3-0 SH 18 (SUTURE) ×2 IMPLANT
SYR BULB IRRIGATION 50ML (SYRINGE) ×2 IMPLANT
SYR CONTROL 10ML LL (SYRINGE) ×2 IMPLANT
TAPE CLOTH SURG 4X10 WHT LF (GAUZE/BANDAGES/DRESSINGS) ×2 IMPLANT
TAPE STRIPS DRAPE STRL (GAUZE/BANDAGES/DRESSINGS) ×2 IMPLANT
TOWEL OR 17X26 10 PK STRL BLUE (TOWEL DISPOSABLE) ×2 IMPLANT
TOWEL OR NON WOVEN STRL DISP B (DISPOSABLE) ×2 IMPLANT
YANKAUER SUCT BULB TIP 10FT TU (MISCELLANEOUS) ×2 IMPLANT

## 2015-11-03 NOTE — Interval H&P Note (Signed)
History and Physical Interval Note:  11/03/2015 1:11 PM  James Hays  has presented today for surgery, with the diagnosis of Primary hyperparathyroidism.  The various methods of treatment have been discussed with the patient and family. After consideration of risks, benefits and other options for treatment, the patient has consented to    Procedure(s): PARATHYROIDECTOMY (N/A) as a surgical intervention .    The patient's history has been reviewed, patient examined, no change in status, stable for surgery.  I have reviewed the patient's chart and labs.  Questions were answered to the patient's satisfaction.    Earnstine Regal, MD, [facility] Office: [place]

## 2015-11-03 NOTE — Anesthesia Preprocedure Evaluation (Signed)
Anesthesia Evaluation  Patient identified by MRN, date of birth, ID band Patient awake    Reviewed: Allergy & Precautions, NPO status , Patient's Chart, lab work & pertinent test results  Airway Mallampati: II  TM Distance: >3 FB Neck ROM: Full    Dental no notable dental hx. (+) Edentulous Upper, Edentulous Lower   Pulmonary Current Smoker,    Pulmonary exam normal breath sounds clear to auscultation       Cardiovascular negative cardio ROS Normal cardiovascular exam Rhythm:Regular Rate:Normal     Neuro/Psych negative neurological ROS  negative psych ROS   GI/Hepatic negative GI ROS, Neg liver ROS,   Endo/Other  negative endocrine ROS  Renal/GU Renal disease  negative genitourinary   Musculoskeletal negative musculoskeletal ROS (+)   Abdominal   Peds negative pediatric ROS (+)  Hematology negative hematology ROS (+)   Anesthesia Other Findings   Reproductive/Obstetrics negative OB ROS                             Anesthesia Physical Anesthesia Plan  ASA: II  Anesthesia Plan: General   Post-op Pain Management:    Induction: Intravenous  Airway Management Planned: Oral ETT  Additional Equipment:   Intra-op Plan:   Post-operative Plan: Extubation in OR  Informed Consent: I have reviewed the patients History and Physical, chart, labs and discussed the procedure including the risks, benefits and alternatives for the proposed anesthesia with the patient or authorized representative who has indicated his/her understanding and acceptance.   Dental advisory given  Plan Discussed with: CRNA  Anesthesia Plan Comments:         Anesthesia Quick Evaluation

## 2015-11-03 NOTE — Anesthesia Postprocedure Evaluation (Signed)
Anesthesia Post Note  Patient: James Hays  Procedure(s) Performed: Procedure(s) (LRB): PARATHYROIDECTOMY (N/A)  Patient location during evaluation: PACU Anesthesia Type: General Level of consciousness: awake and alert Pain management: pain level controlled Vital Signs Assessment: post-procedure vital signs reviewed and stable Respiratory status: spontaneous breathing, nonlabored ventilation, respiratory function stable and patient connected to nasal cannula oxygen Cardiovascular status: blood pressure returned to baseline and stable Postop Assessment: no signs of nausea or vomiting Anesthetic complications: no    Last Vitals:  Filed Vitals:   11/03/15 1541 11/03/15 1656  BP: 118/86 138/89  Pulse: 85 86  Temp: 36.4 C 36.4 C  Resp: 15     Last Pain:  Filed Vitals:   11/03/15 1701  PainSc: 2                  [name]

## 2015-11-03 NOTE — Anesthesia Procedure Notes (Signed)
Procedure Name: Intubation Date/Time: 11/03/2015 1:58 PM Performed by: Freddie Breech Pre-anesthesia Checklist: Patient identified, Emergency Drugs available, Suction available, Patient being monitored and Timeout performed Patient Re-evaluated:Patient Re-evaluated prior to inductionOxygen Delivery Method: Circle system utilized Preoxygenation: Pre-oxygenation with 100% oxygen Intubation Type: IV induction Ventilation: Mask ventilation without difficulty and Oral airway inserted - appropriate to patient size Laryngoscope Size: Mac and 4 Grade View: Grade I Tube type: Oral Tube size: 6.5 mm Number of attempts: 1 Airway Equipment and Method: Patient positioned with wedge pillow and Stylet Placement Confirmation: ETT inserted through vocal cords under direct vision,  positive ETCO2,  CO2 detector and breath sounds checked- equal and bilateral Secured at: 21 cm Tube secured with: Tape Dental Injury: Teeth and Oropharynx as per pre-operative assessment

## 2015-11-03 NOTE — Discharge Instructions (Signed)

## 2015-11-03 NOTE — Transfer of Care (Signed)
Immediate Anesthesia Transfer of Care Note  Patient: James Hays  Procedure(s) Performed: Procedure(s): PARATHYROIDECTOMY (N/A)  Patient Location: PACU  Anesthesia Type:General  Level of Consciousness:  sedated, patient cooperative and responds to stimulation  Airway & Oxygen Therapy:Patient Spontanous Breathing and Patient connected to face mask oxgen  Post-op Assessment:  Report given to PACU RN and Post -op Vital signs reviewed and stable  Post vital signs:  Reviewed and stable  Last Vitals:  Filed Vitals:   11/03/15 1030  BP: 129/97  Pulse: 73  Temp: 36.2 C  Resp: 18    Complications: No apparent anesthesia complications

## 2015-11-03 NOTE — Op Note (Signed)
OPERATIVE REPORT - PARATHYROIDECTOMY  Preoperative diagnosis: Primary hyperparathyroidism  Postop diagnosis: Same  Procedure: Left inferior minimally invasive parathyroidectomy  Surgeon:  Earnstine Regal, MD, FACS  Anesthesia: Gen. endotracheal  Estimated blood loss: Minimal  Preparation: ChloraPrep  Indications: Patient referred by Dr. Irine Seal for evaluation of suspected primary hyperparathyroidism. Patient's primary care physician is Dr. Jenna Luo. Patient has a long-standing history of nephrolithiasis dating back approximately 30 years. Patient was noted several years ago to have hypercalcemia. He was living in [place] at that time. Workup was initiated but no radiographic imaging was performed and certainly the patient has never had surgery for parathyroid disease. Patient denies any other signs or symptoms than nephrolithiasis. Nuclear med parathyroid scan localized an adenoma to the left inferior position.  Patient now comes to surgery for minimally invasive parathyroidectomy.  Procedure: Patient was prepared in the holding area. He was brought to operating room and placed in a supine position on the operating room table. Following administration of general anesthesia, the patient was positioned and then prepped and draped in the usual strict aseptic fashion. After ascertaining that an adequate level of anesthesia been achieved, a neck incision was made with a #15 blade. Dissection was carried through subcutaneous tissues and platysma. Hemostasis was obtained with the electrocautery. Skin flaps were developed circumferentially and a Weitlander retractor was placed for exposure.  Strap muscles were incised in the midline. Strap muscles were reflected exposing the thyroid lobe. With gentle blunt dissection the thyroid lobe was mobilized.  Dissection was carried through adipose tissue and an enlarged parathyroid gland was identified. It was gently mobilized. Vascular structures  were divided between small and medium ligaclips. Care was taken to avoid the recurrent laryngeal nerve and the esophagus. The parathyroid gland was completely excised. It was submitted to pathology where frozen section confirmed parathyroid tissue consistent with adenoma.  Neck was irrigated with warm saline and good hemostasis was noted. Fibrillar was placed in the operative field. Strap muscles were reapproximated in the midline with interrupted 3-0 Vicryl sutures. Platysma was closed with interrupted 3-0 Vicryl sutures. Skin was closed with a running 4-0 Monocryl subcuticular suture. Marcaine was infiltrated circumferentially. Wound was washed and dried and benzoin and Steri-Strips were applied. Sterile gauze dressings were applied. Patient was awakened from anesthesia and brought to the recovery room. The patient tolerated the procedure well.   Earnstine Regal, MD, [facility]

## 2015-11-05 ENCOUNTER — Emergency Department (HOSPITAL_COMMUNITY)
Admission: EM | Admit: 2015-11-05 | Discharge: 2015-11-05 | Disposition: A | Discharge status: Home / Self Care | Payer: BLUE CROSS/BLUE SHIELD | Attending: Emergency Medicine | Admitting: Emergency Medicine

## 2015-11-05 ENCOUNTER — Encounter (HOSPITAL_COMMUNITY): Payer: Self-pay | Admitting: Emergency Medicine

## 2015-11-05 ENCOUNTER — Emergency Department (HOSPITAL_COMMUNITY): Payer: BLUE CROSS/BLUE SHIELD

## 2015-11-05 DIAGNOSIS — R42 Dizziness and giddiness: Secondary | ICD-10-CM | POA: Insufficient documentation

## 2015-11-05 DIAGNOSIS — R0602 Shortness of breath: Secondary | ICD-10-CM | POA: Insufficient documentation

## 2015-11-05 DIAGNOSIS — R69 Illness, unspecified: Secondary | ICD-10-CM | POA: Diagnosis present

## 2015-11-05 DIAGNOSIS — Z7982 Long term (current) use of aspirin: Secondary | ICD-10-CM | POA: Insufficient documentation

## 2015-11-05 DIAGNOSIS — F17210 Nicotine dependence, cigarettes, uncomplicated: Secondary | ICD-10-CM | POA: Diagnosis not present

## 2015-11-05 DIAGNOSIS — E8989 Other postprocedural endocrine and metabolic complications and disorders: Secondary | ICD-10-CM

## 2015-11-05 DIAGNOSIS — E213 Hyperparathyroidism, unspecified: Secondary | ICD-10-CM | POA: Insufficient documentation

## 2015-11-05 DIAGNOSIS — E785 Hyperlipidemia, unspecified: Secondary | ICD-10-CM | POA: Insufficient documentation

## 2015-11-05 DIAGNOSIS — Z79899 Other long term (current) drug therapy: Secondary | ICD-10-CM | POA: Insufficient documentation

## 2015-11-05 DIAGNOSIS — M1990 Unspecified osteoarthritis, unspecified site: Secondary | ICD-10-CM | POA: Diagnosis not present

## 2015-11-05 HISTORY — DX: Hypokalemia: E87.6

## 2015-11-05 LAB — COMPREHENSIVE METABOLIC PANEL
ALBUMIN: 4.2 g/dL (ref 3.5–5.0)
ALK PHOS: 61 U/L (ref 38–126)
ALT: 27 U/L (ref 17–63)
ANION GAP: 10 (ref 5–15)
AST: 26 U/L (ref 15–41)
BILIRUBIN TOTAL: 0.6 mg/dL (ref 0.3–1.2)
BUN: 17 mg/dL (ref 6–20)
CALCIUM: 9.7 mg/dL (ref 8.9–10.3)
CO2: 26 mmol/L (ref 22–32)
CREATININE: 1.07 mg/dL (ref 0.61–1.24)
Chloride: 107 mmol/L (ref 101–111)
GFR calc Af Amer: >60 mL/min (ref >60)
GFR calc non Af Amer: >60 mL/min (ref >60)
GLUCOSE: 113 mg/dL — AB (ref 65–99)
Potassium: 4 mmol/L (ref 3.5–5.1)
SODIUM: 143 mmol/L (ref 135–145)
Total Protein: 6.8 g/dL (ref 6.5–8.1)

## 2015-11-05 LAB — CBC WITH DIFFERENTIAL/PLATELET
BASOS PCT: 1 %
Basophils Absolute: 0.1 10*3/uL (ref 0.0–0.1)
EOS PCT: 2 %
Eosinophils Absolute: 0.2 10*3/uL (ref 0.0–0.7)
HEMATOCRIT: 46 % (ref 39.0–52.0)
Hemoglobin: 15.4 g/dL (ref 13.0–17.0)
Lymphocytes Relative: 33 %
Lymphs Abs: 3.3 10*3/uL (ref 0.7–4.0)
MCH: 31.6 pg (ref 26.0–34.0)
MCHC: 33.5 g/dL (ref 30.0–36.0)
MCV: 94.5 fL (ref 78.0–100.0)
MONO ABS: 0.8 10*3/uL (ref 0.1–1.0)
MONOS PCT: 8 %
NEUTROS ABS: 5.7 10*3/uL (ref 1.7–7.7)
Neutrophils Relative %: 56 %
PLATELETS: 260 10*3/uL (ref 150–400)
RBC: 4.87 MIL/uL (ref 4.22–5.81)
RDW: 13.3 % (ref 11.5–15.5)
WBC: 10 10*3/uL (ref 4.0–10.5)

## 2015-11-05 LAB — TROPONIN I: Troponin I: <0.03 ng/mL (ref <0.031)

## 2015-11-05 NOTE — ED Provider Notes (Signed)
CSN: [id number]     Arrival date & time 11/05/15  0940 History  By signing my name below, I, Soijett Blue, attest that this documentation has been prepared under the direction and in the presence of Leonard Schwartz, MD. Electronically Signed: Soijett Blue, ED Scribe. 11/05/2015. 9:53 AM.   Chief Complaint  Patient presents with  . Illness      The history is provided by the patient. No language interpreter was used.    HPI Comments: James Hays is a 65 y.o. male who presents to the Emergency Department complaining of feeling ill onset this morning. Pt notes that he sprayed chemicals to his yard this morning and he is unsure if that is the cause of his symptoms. Pt states that he has had parathyroidectomy completed 3 days ago [facility] by Dr. Harlow Asa prior to the onset of his symptoms. He states that he is having associated symptoms of lightheadedness, SOB, and tingling to bilateral arms and chest. He states that he has not tried any medications for with no relief for his symptoms. He denies any other symptoms.   Past Medical History  Diagnosis Date  . Hyperlipidemia   . Hyperparathyroidism ([place])   . Hypercalcemia   . Erectile dysfunction   . Kidney stones   . Arthritis   . Hypokalemia    Past Surgical History  Procedure Laterality Date  . Vasectomy    . Fracture surgery      L foot  . Hydrocele excision    . Hernia repair      inguinal and umbilical  . Back surgery    . Placement of lumbar drain N/A 02/11/2015    Procedure: PLACEMENT OF LUMBAR DRAIN;  Surgeon: [name], MD;  Location: [place] NEURO ORS;  Service: Neurosurgery;  Laterality: N/A;  . Lumbar wound debridement N/A 02/14/2015    Procedure: EXPLORATION LUMBAR WOUND, REPAIR OF CSF LEAK;  Surgeon: [name], MD;  Location: [place] NEURO ORS;  Service: Neurosurgery;  Laterality: N/A;  . Cystoscopy with ureteroscopy Left 06/09/2015    Procedure: CYSTOSCOPY WITH URETEROSCOPY;  Surgeon: Irine Seal, MD;  Location: [facility]  ORS;  Service: Urology;  Laterality: Left;  . Stone extraction with basket N/A 06/09/2015    Procedure: STONE EXTRACTION WITH BASKET;  Surgeon: Irine Seal, MD;  Location: [facility] ORS;  Service: Urology;  Laterality: N/A;  . Thyroid surgery     Family History  Problem Relation Age of Onset  . Hypertension Mother   . Diabetes Mother   . Heart disease Father 70  . Hyperlipidemia Father   . Diabetes Brother    Social History  Substance Use Topics  . Smoking status: Current Every Day Smoker -- 1.00 packs/day for 40 years    Types: Cigarettes  . Smokeless tobacco: Never Used  . Alcohol Use: No    Review of Systems  Respiratory: Positive for shortness of breath.   Neurological: Positive for dizziness and light-headedness.       Tingling to bilateral arms and chest  All other systems reviewed and are negative.     Allergies  Review of patient's allergies indicates no known allergies.  Home Medications   Prior to Admission medications   Medication Sig Start Date End Date Taking? Authorizing Provider  aspirin 81 MG tablet Take 81 mg by mouth daily.   Yes Historical Provider, MD  cholecalciferol (VITAMIN D) 400 UNITS TABS tablet Take 400 Units by mouth daily.    Yes Historical Provider, MD  HYDROcodone-acetaminophen (NORCO/VICODIN)  5-325 MG tablet Take 1-2 tablets by mouth every 4 (four) hours as needed for moderate pain. 11/03/15  Yes Armandina Gemma, MD  ibuprofen (ADVIL,MOTRIN) 200 MG tablet Take 200 mg by mouth every 6 (six) hours as needed (pain).    Yes Historical Provider, MD  Triamcinolone Acetonide (NASACORT AQ NA) Place 1 spray into the nose daily as needed (allergies).   Yes Historical Provider, MD  VIAGRA 100 MG tablet Take 100 mg by mouth daily as needed. Sexual activity 09/08/15  Yes Historical Provider, MD  oxyCODONE-acetaminophen (ROXICET) 5-325 MG tablet Take 1 tablet by mouth every 4 (four) hours as needed for severe pain. Patient not taking: Reported on 11/05/2015 06/09/15   Irine Seal, MD   BP 148/93 mmHg  Pulse 101  Temp(Src) 97.5 F (36.4 C) (Oral)  Resp 16  Ht 5\' 8"  (1.727 m)  Wt 164 lb (74.39 kg)  BMI 24.94 kg/m2  SpO2 99% Physical Exam Physical Exam  Nursing note and vitals reviewed. Constitutional: He is oriented to person, place, and time. He appears well-developed and well-nourished. No distress.  HENT:  Head: Normocephalic and atraumatic.  Eyes: Pupils are equal, round, and reactive to light.  Neck: Normal range of motion.  Anterior scar from recent parathyroidectomy.  No evidence of drainage or infection.   Cardiovascular: Normal rate and intact distal pulses.   Pulmonary/Chest: No respiratory distress.  Abdominal: Normal appearance. He exhibits no distension.  Musculoskeletal: Normal range of motion.  Neurological: He is alert and oriented to person, place, and time. No cranial nerve deficit.  No weakness or sensory deficit.   Skin: Skin is warm and dry. No rash noted.  Psychiatric: He has a normal mood and affect. His behavior is normal.   ED Course  Procedures (including critical care time) DIAGNOSTIC STUDIES: Oxygen Saturation is 99% on RA, nl by my interpretation.    COORDINATION OF CARE: 9:53 AM Discussed treatment plan with pt at bedside which includes CXR, labs, and pt agreed to plan.    Labs Review Labs Reviewed  COMPREHENSIVE METABOLIC PANEL - Abnormal; Notable for the following:    Glucose, Bld 113 (*)    All other components within normal limits  TROPONIN I  CBC WITH DIFFERENTIAL/PLATELET    Imaging Review Dg Chest 2 View  11/05/2015  CLINICAL DATA:  Weakness. EXAM: CHEST  2 VIEW COMPARISON:  October 25, 2015 FINDINGS: The heart size and mediastinal contours are within normal limits. Both lungs are clear. The visualized skeletal structures are unremarkable. IMPRESSION: No active cardiopulmonary disease. Electronically Signed   By: Dorise Bullion III M.D   On: 11/05/2015 11:04   I have personally reviewed and evaluated  these images and lab results as part of my medical decision-making.    MDM   Final diagnoses:  Other postoperative complication of endocrine system   I personally performed the services described in this documentation, which was scribed in my presence. The recorded information has been reviewed and considered.      Leonard Schwartz, MD 11/05/15 [phone]

## 2015-11-05 NOTE — ED Notes (Addendum)
Pt reports sudden onset of lightheadedness, SOB, and tingling to bilateral arms and chest. Repeatedly states, "I just don't feel good." Denies any pain. Denies n/v/d. States these symptoms began after spraying chemicals in his yard. Pt had thyroid surgery on Friday. EDP at bedside.

## 2015-11-06 ENCOUNTER — Encounter (HOSPITAL_COMMUNITY): Payer: Self-pay | Admitting: Surgery

## 2015-12-29 ENCOUNTER — Ambulatory Visit (INDEPENDENT_AMBULATORY_CARE_PROVIDER_SITE_OTHER): Payer: BLUE CROSS/BLUE SHIELD | Admitting: Urology

## 2015-12-29 ENCOUNTER — Other Ambulatory Visit (HOSPITAL_COMMUNITY)
Admission: RE | Admit: 2015-12-29 | Discharge: 2015-12-29 | Disposition: A | Discharge status: Home / Self Care | Payer: BLUE CROSS/BLUE SHIELD | Source: Other Acute Inpatient Hospital | Attending: Urology | Admitting: Urology

## 2015-12-29 DIAGNOSIS — N200 Calculus of kidney: Secondary | ICD-10-CM | POA: Diagnosis not present

## 2015-12-29 DIAGNOSIS — E213 Hyperparathyroidism, unspecified: Secondary | ICD-10-CM

## 2015-12-29 DIAGNOSIS — N209 Urinary calculus, unspecified: Secondary | ICD-10-CM | POA: Diagnosis not present

## 2015-12-29 LAB — URINALYSIS, ROUTINE W REFLEX MICROSCOPIC
Bilirubin Urine: NEGATIVE
GLUCOSE, UA: NEGATIVE mg/dL
Hgb urine dipstick: NEGATIVE
Ketones, ur: NEGATIVE mg/dL
LEUKOCYTES UA: NEGATIVE
Nitrite: NEGATIVE
PH: 6 (ref 5.0–8.0)
PROTEIN: NEGATIVE mg/dL
Specific Gravity, Urine: 1.02 (ref 1.005–1.030)

## 2016-01-01 ENCOUNTER — Other Ambulatory Visit: Payer: Self-pay | Admitting: Urology

## 2016-01-01 ENCOUNTER — Ambulatory Visit (HOSPITAL_COMMUNITY)
Admission: RE | Admit: 2016-01-01 | Discharge: 2016-01-01 | Disposition: A | Discharge status: Home / Self Care | Payer: BLUE CROSS/BLUE SHIELD | Source: Ambulatory Visit | Attending: Urology | Admitting: Urology

## 2016-01-01 DIAGNOSIS — N200 Calculus of kidney: Secondary | ICD-10-CM

## 2016-01-01 DIAGNOSIS — Z09 Encounter for follow-up examination after completed treatment for conditions other than malignant neoplasm: Secondary | ICD-10-CM | POA: Diagnosis not present

## 2016-01-01 DIAGNOSIS — Z87442 Personal history of urinary calculi: Secondary | ICD-10-CM | POA: Insufficient documentation

## 2016-05-20 ENCOUNTER — Encounter: Payer: Self-pay | Admitting: Physician Assistant

## 2016-05-20 ENCOUNTER — Ambulatory Visit (INDEPENDENT_AMBULATORY_CARE_PROVIDER_SITE_OTHER): Payer: BLUE CROSS/BLUE SHIELD | Admitting: Physician Assistant

## 2016-05-20 VITALS — BP 116/80 | HR 71 | Temp 97.9°F | Resp 16 | Wt 173.0 lb

## 2016-05-20 DIAGNOSIS — Z23 Encounter for immunization: Secondary | ICD-10-CM

## 2016-05-20 DIAGNOSIS — H60393 Other infective otitis externa, bilateral: Secondary | ICD-10-CM | POA: Diagnosis not present

## 2016-05-20 MED ORDER — CLOTRIMAZOLE 1 % EX SOLN: 1.0000 "application " | Freq: Two times a day (BID) | CUTANEOUS | 0 refills | Status: AC

## 2016-05-20 MED ORDER — ACETIC ACID 2 % OT SOLN: 4.0000 [drp] | Freq: Three times a day (TID) | OTIC | 0 refills | Status: AC

## 2016-05-20 NOTE — Addendum Note (Signed)
Addended by: Vonna Kotyk A on: 05/20/2016 10:57 AM   Modules accepted: Orders

## 2016-05-20 NOTE — Progress Notes (Signed)
Patient ID: James Hays MRN: [id number], DOB: 01/31/1951, 65 y.o. Date of Encounter: 05/20/2016, 10:27 AM    Chief Complaint:  Chief Complaint  Patient presents with  . Ear Pain    left and right x 2 wks      HPI: 65 y.o. year old male presents with above.   He says he has a history of having a lot of problems with his ears. Says he had fungus and mold. Would go to ENT and have them clean it out and treat the fungus and mold. Brought in 2 medicine bottles of ear drops. Points to these and says "these medicines--- I live by them." "If I don't have these, I have to see ENT to clean everything out of my ears ". Says that his prior ENT was in [place]. Says he moved here about a year ago. He hadn't had problems with his ears until the past couple of weeks. Says he has been having drainage coming out of the right ear and cracking of the external right ear. Says that the left ear has some burning and itching. Says that he tried to get an appointment with ENT but it was going to be a long time before they could see him. In the past when his ears would flare he had to use the drops daily. Recently he has just been using them here and there --was afraid that they would run out if he used them daily. I told him that I can refill these medicines for him to be using in the interim but that he definitely needs to follow-up with ENT. Says that he wants to see the ENT that is located across from [facility] in the same building as the Urologist Dr. Jeffie Pollock.     Home Meds:   Outpatient Medications Prior to Visit  Medication Sig Dispense Refill  . aspirin 81 MG tablet Take 81 mg by mouth daily.    Marland Kitchen ibuprofen (ADVIL,MOTRIN) 200 MG tablet Take 200 mg by mouth every 6 (six) hours as needed (pain).     . Triamcinolone Acetonide (NASACORT AQ NA) Place 1 spray into the nose daily as needed (allergies).    Marland Kitchen VIAGRA 100 MG tablet Take 100 mg by mouth daily as needed. Sexual activity  11  . cholecalciferol  (VITAMIN D) 400 UNITS TABS tablet Take 400 Units by mouth daily.     Marland Kitchen HYDROcodone-acetaminophen (NORCO/VICODIN) 5-325 MG tablet Take 1-2 tablets by mouth every 4 (four) hours as needed for moderate pain. (Patient not taking: Reported on 05/20/2016) 20 tablet 0  . oxyCODONE-acetaminophen (ROXICET) 5-325 MG tablet Take 1 tablet by mouth every 4 (four) hours as needed for severe pain. (Patient not taking: Reported on 05/20/2016) 15 tablet 0   No facility-administered medications prior to visit.     Allergies: No Known Allergies    Review of Systems: See HPI for pertinent ROS. All other ROS negative.    Physical Exam: Blood pressure 116/80, pulse 71, temperature 97.9 F (36.6 C), temperature source Oral, resp. rate 16, weight 173 lb (78.5 kg), SpO2 98 %., Body mass index is 26.3 kg/m. General: WNWD WM.  Appears in no acute distress. HEENT: Normocephalic, atraumatic, eyes without discharge, sclera non-icteric, nares are without discharge. Right Ear: There is a lot of scale, desquamation of external ear canal. There is one crack in the skin just above the tragus. TM is intact. Right Ear--canal with minimal scale/desquamation. TM intact, normal.  Neck: Supple. No thyromegaly. No  lymphadenopathy. Lungs: Clear bilaterally to auscultation without wheezes, rales, or rhonchi. Breathing is unlabored. Heart: Regular rhythm. No murmurs, rubs, or gallops. Msk:  Strength and tone normal for age. Extremities/Skin: Warm and dry.  Neuro: Alert and oriented X 3. Moves all extremities spontaneously. Gait is normal. CNII-XII grossly in tact. Psych:  Responds to questions appropriately with a normal affect.     ASSESSMENT AND PLAN:  65 y.o. year old male with  1. Infective otitis externa of both ears I have marked the referral is urgent. We will get him in with the office that he is requesting. He is to use the drops in the interim but is to definitely follow-up with ENT. He voices understanding and  agrees. - Ambulatory referral to ENT - clotrimazole (LOTRIMIN) 1 % external solution; Apply 1 application topically 2 (two) times daily.  Dispense: 30 mL; Refill: 0 - acetic acid (VOSOL) 2 % otic solution; Place 4 drops into both ears 3 (three) times daily.  Dispense: 15 mL; Refill: 0   Signed, [address] [place], [place], [facility] 05/20/2016 10:27 AM

## 2016-09-11 ENCOUNTER — Encounter: Payer: Self-pay | Admitting: Family Medicine

## 2016-10-10 ENCOUNTER — Ambulatory Visit (INDEPENDENT_AMBULATORY_CARE_PROVIDER_SITE_OTHER): Payer: BLUE CROSS/BLUE SHIELD | Admitting: Otolaryngology

## 2016-10-10 DIAGNOSIS — J342 Deviated nasal septum: Secondary | ICD-10-CM

## 2016-10-10 DIAGNOSIS — R51 Headache: Secondary | ICD-10-CM

## 2016-10-10 DIAGNOSIS — J343 Hypertrophy of nasal turbinates: Secondary | ICD-10-CM

## 2016-10-10 DIAGNOSIS — H60543 Acute eczematoid otitis externa, bilateral: Secondary | ICD-10-CM | POA: Diagnosis not present

## 2016-10-24 ENCOUNTER — Ambulatory Visit (INDEPENDENT_AMBULATORY_CARE_PROVIDER_SITE_OTHER): Payer: BLUE CROSS/BLUE SHIELD | Admitting: Otolaryngology

## 2016-10-24 DIAGNOSIS — J342 Deviated nasal septum: Secondary | ICD-10-CM | POA: Diagnosis not present

## 2016-10-24 DIAGNOSIS — J310 Chronic rhinitis: Secondary | ICD-10-CM

## 2016-10-24 DIAGNOSIS — H60333 Swimmer's ear, bilateral: Secondary | ICD-10-CM | POA: Diagnosis not present

## 2016-11-02 IMAGING — RF DG C-ARM 61-120 MIN
1 series · 1 of 1 positions shown · non-contrast
Comparison: Intraoperative lumbar images 8 /[DATE] [HOSPITAL]
special [REDACTED], and earlier.

CLINICAL DATA: 64-year-old male undergoing lumbar drain placement.
Initial encounter.

EXAM:
DG C-ARM 61-120 MIN; LUMBAR SPINE - 1 VIEW

[Series 1: run · 1 of 1 slices shown]
[im 1/1]
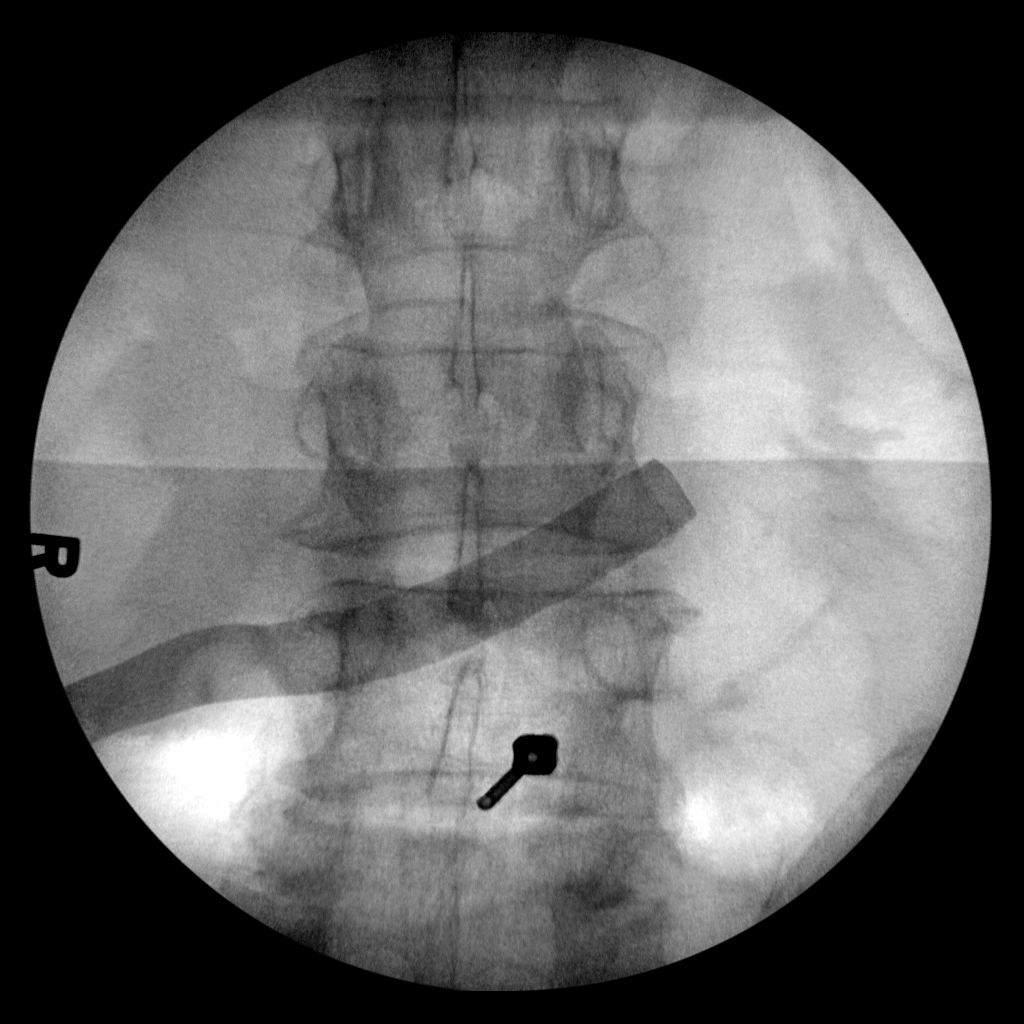

[1 of 1 positions shown; findings below may reference images not displayed]

FINDINGS: Single fluoroscopic AP view of the lower lumbar spine. Spinal needle
projects at what appears to be the L5-S1 level, left sub laminar
position.

FLUOROSCOPY TIME:  0 min 11 seconds
IMPRESSION: Spinal needle projecting over the lower lumbar spine.

## 2016-11-04 IMAGING — MR MR LUMBAR SPINE WO/W CM
4 of 7 series · 18 of 48 positions shown · IV contrast (Yes)
Comparison: MRI of the lumbar spine January 25, 2015

CLINICAL DATA: Status post L3-4 micro diskectomy 10 days ago.
Drainage from surgical wound beginning 2 days ago. Postural severe
headache, with diagnosed postoperative CSF leak. Blood cultures
positive for staph aureus. Febrile. Assess bacteremia.

EXAM:
MRI LUMBAR SPINE WITHOUT AND WITH CONTRAST
TECHNIQUE: Multiplanar and multiecho pulse sequences of the lumbar spine were
obtained without and with intravenous contrast.
CONTRAST:  17mL MULTIHANCE GADOBENATE DIMEGLUMINE 529 MG/ML IV SOLN

[Series 300: T2 · sagittal · 4.0mm · 0.55mm/px · 3 of 15 slices shown (1 of 2)]
[im 1/15]
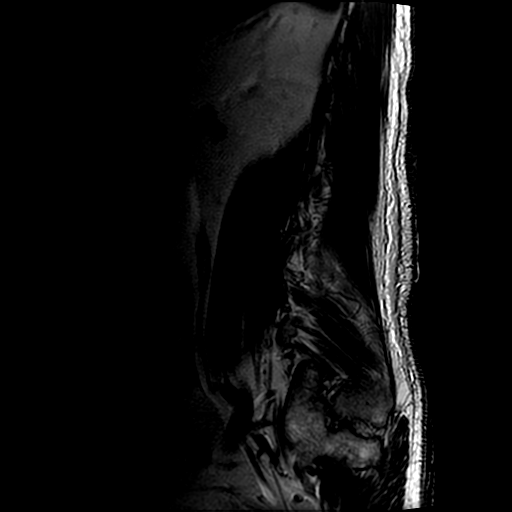
[im 8/15]
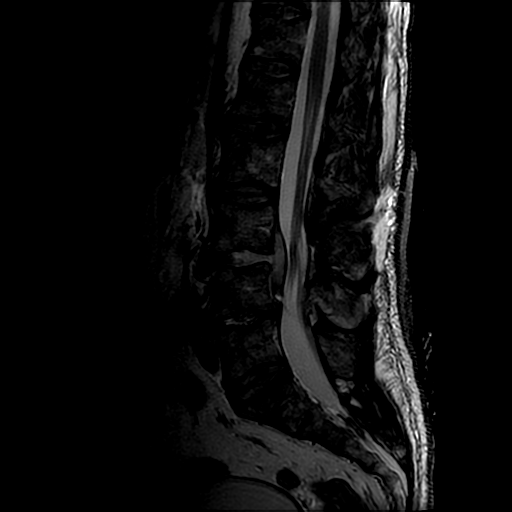
[im 15/15]
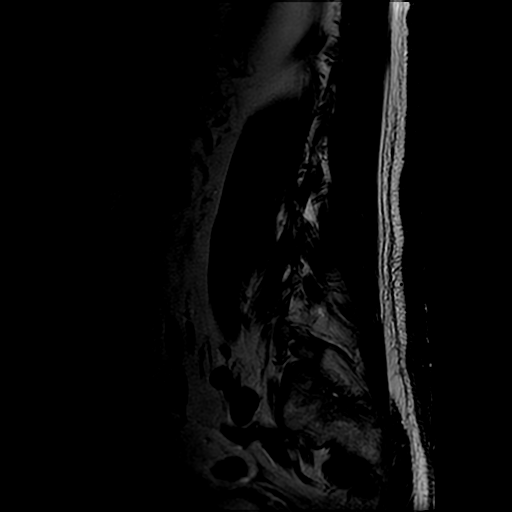

[Series 500: T1 · sagittal · 4.0mm · 0.55mm/px · 3 of 15 slices shown (1 of 2)]
[im 1/15]
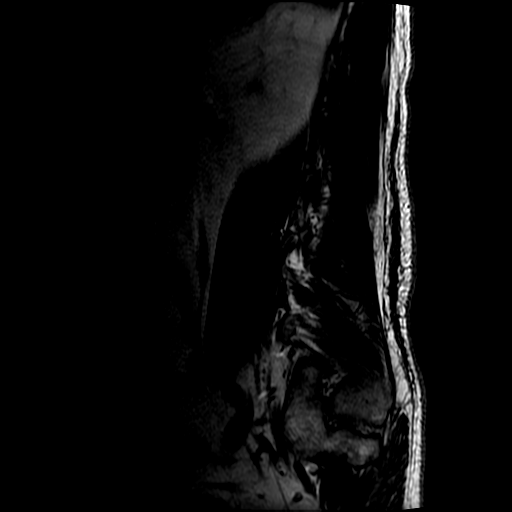
[im 10/15]
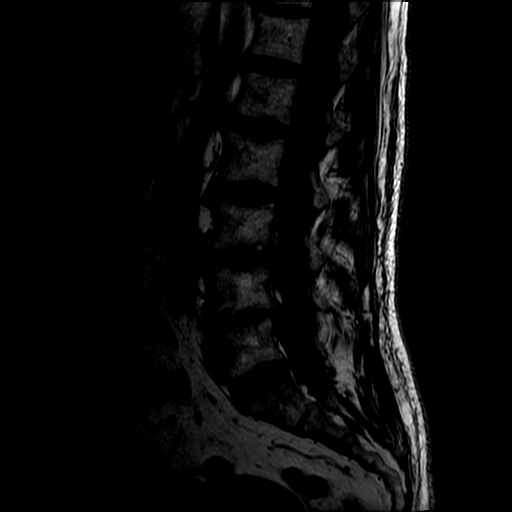
[im 15/15]
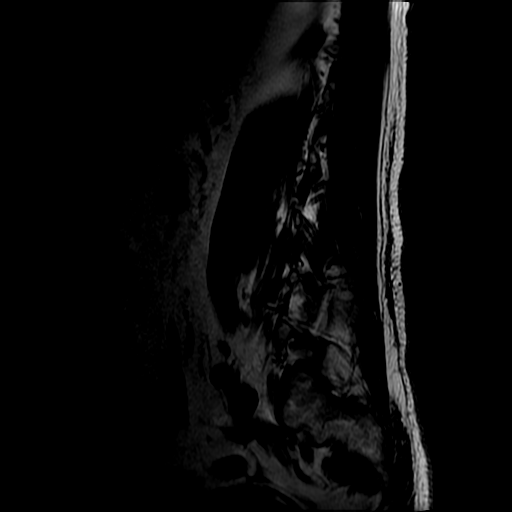

[Series 700: T1 · axial · 4.0mm · 0.39mm/px · z∈[-39,+121]mm · 3 of 40 slices shown (2 of 2)]
[im 4/40]
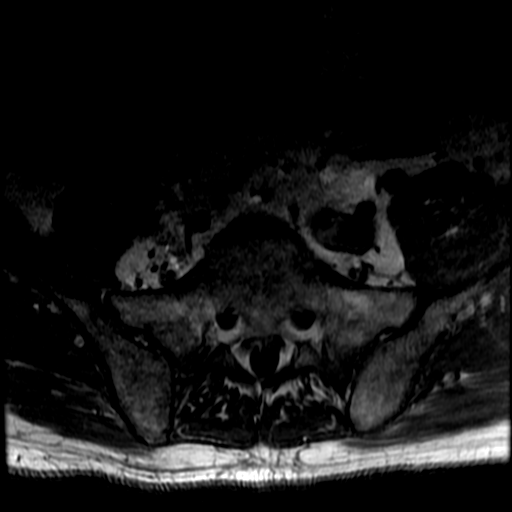
[im 20/40]
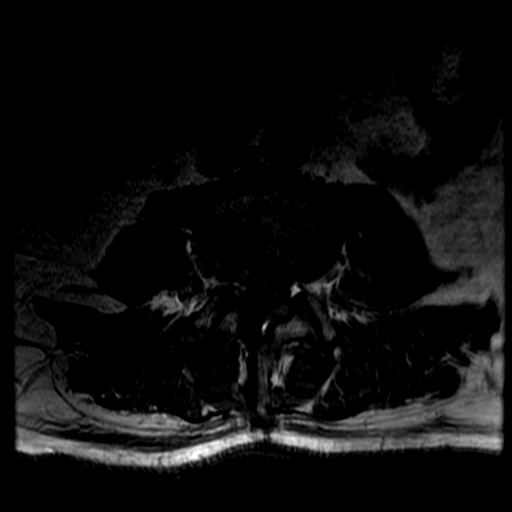
[im 36/40]
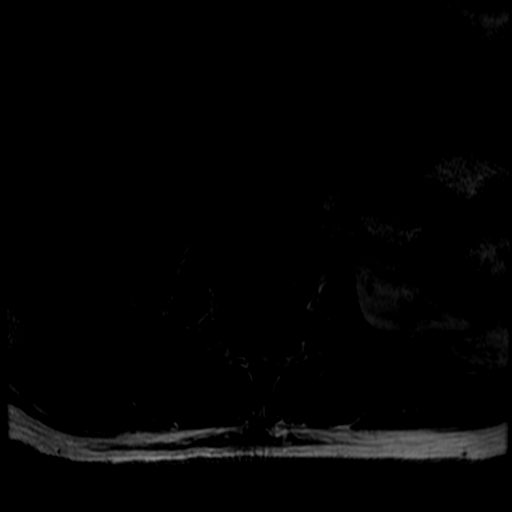

[Series 800: T2 · axial · 4.0mm · 0.39mm/px · z∈[-54,+121]mm · 9 of 40 slices shown (2 of 2)]
[im 1/40]
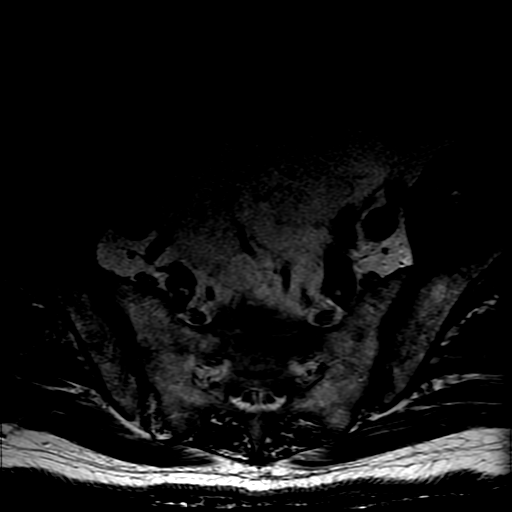
[im 4/40]
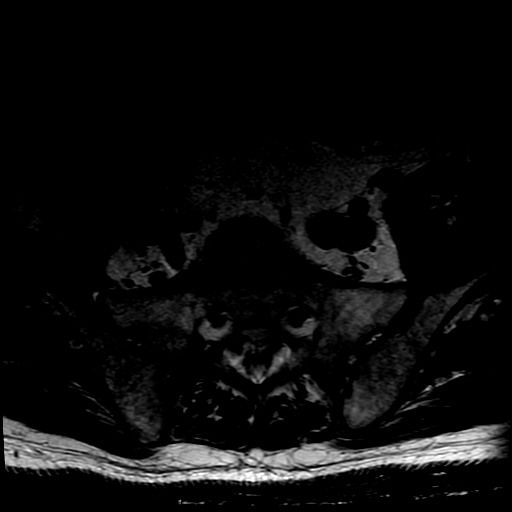
[im 8/40]
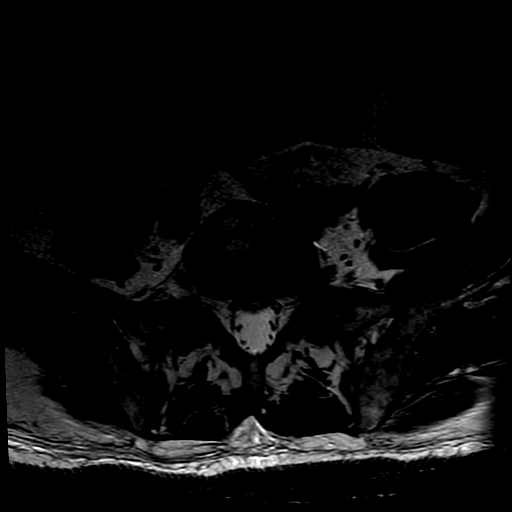
[im 12/40]
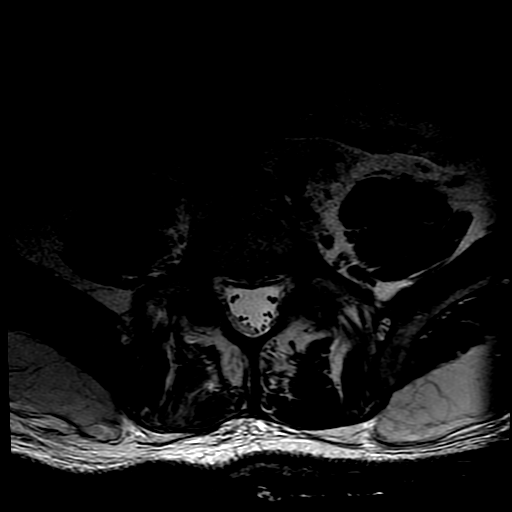
[im 16/40]
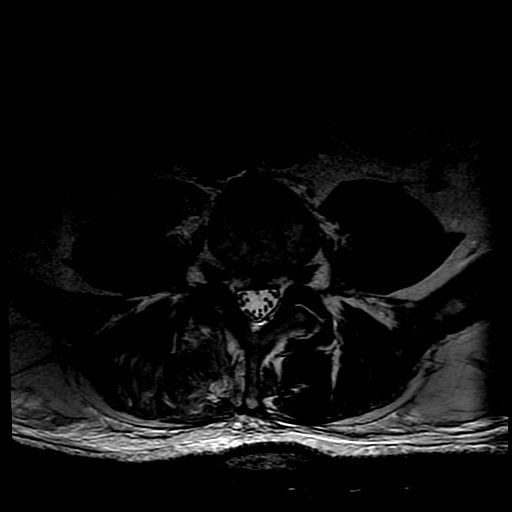
[im 20/40]
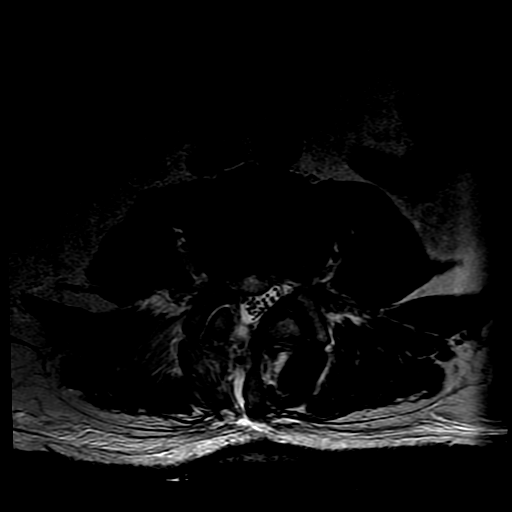
[im 24/40]
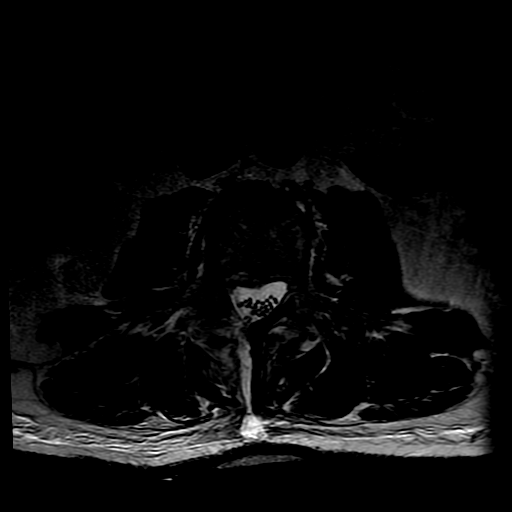
[im 28/40]
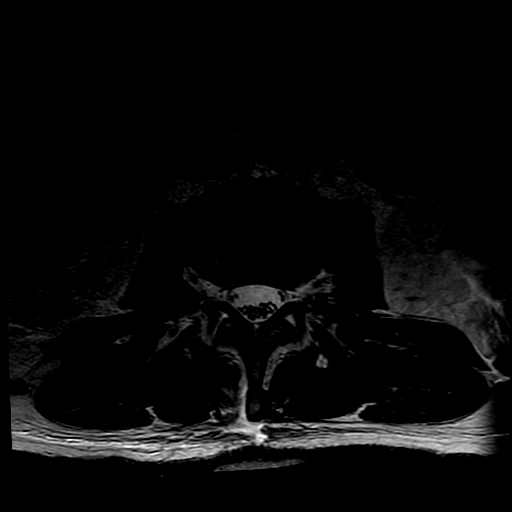
[im 36/40]
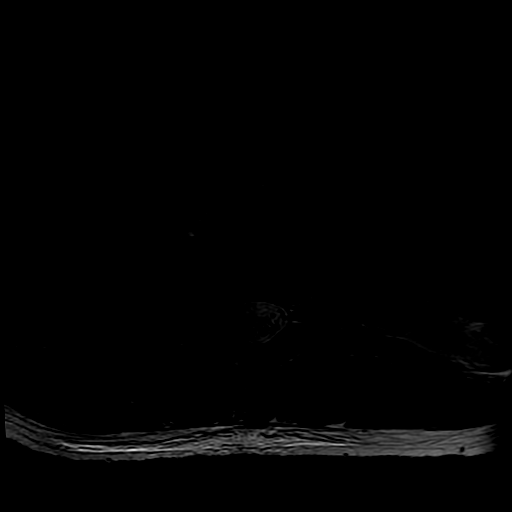

[18 of 48 positions shown; findings below may reference images not displayed]

FINDINGS: Using previously described reference levels, interval RIGHT L3
hemilaminectomy. Focal low T1, bright T2 sub cm fluid collection
within the bony defect, contiguous with the surgical approach as
seen on axial 19/40, faint rim enhancement. 4.3 x 0.8 x 0.8 mm
bright T2, low T1 rim enhancing ventral epidural fluid collection
from mid L3 to L4-5 disc space. Fluid collection is contiguous with
the ventral L3-4 disc, status post L3-4 micro discectomy.

Moderate to severe L4-5 disc height loss is unchanged, however there
is not bright STIR signal within this disc consistent with edema,
moderate acute on chronic discogenic endplate changes without
suspicious intradiscal enhancement at this level. Mild enhancing
discogenic endplate changes L3-4. Scattered chronic Schmorl's nodes.

Conus medullaris terminates at L1 is normal morphology and signal
characteristics. No nerve root clumping or definite leptomeningeal
enhancement though, moderately motion degraded axial post gadolinium
sequence.

Level by level evaluation:

L1-2 and L2-3: No disc bulge, canal stenosis nor neural foraminal
narrowing.

L3-4: Status post RIGHT hemilaminectomy. Ventral epidural fluid
collection effaces the RIGHT lateral recess, posteriorly displacing
the traversing RIGHT L4 nerve. Posterior decompression. Moderate
RIGHT, mild LEFT neural foraminal narrowing.

L4-5: Small broad-based disc bulge, mild facet arthropathy and
ligamentum flavum redundancy without canal stenosis. Moderate RIGHT,
mild LEFT neural foraminal narrowing.

L5-S1: No disc bulge. Moderate facet arthropathy without canal
stenosis or neural foraminal narrowing.
IMPRESSION: 4.3 x 0.8 x 0.8 cm ventral epidural fluid collection from mid L3 to
L4-5 disc space, compatible with epidural abscess, less likely
hematoma. Fluid collection communicates within the dorsal L3-4 disc
concerning for a superimposed discitis. Status post RIGHT L3
hemilaminectomy. Subcentimeter fluid collection within the surgical
bed could represent seroma or even small pseudomeningocele, less
likely abscess.

New edema within the L4-5 disc, this favors reactive changes, less
likely discitis.

No canal stenosis. Moderate RIGHT L3-4 and L4-5 neural foraminal
narrowing, mild on the LEFT at these levels.

## 2017-07-16 IMAGING — DX DG CHEST 2V
2 series · 2 of 2 positions shown · non-contrast
Comparison: 03/14/2015

CLINICAL DATA: Preop for parathyroid surgery

EXAM:
CHEST  2 VIEW

[chest pa]
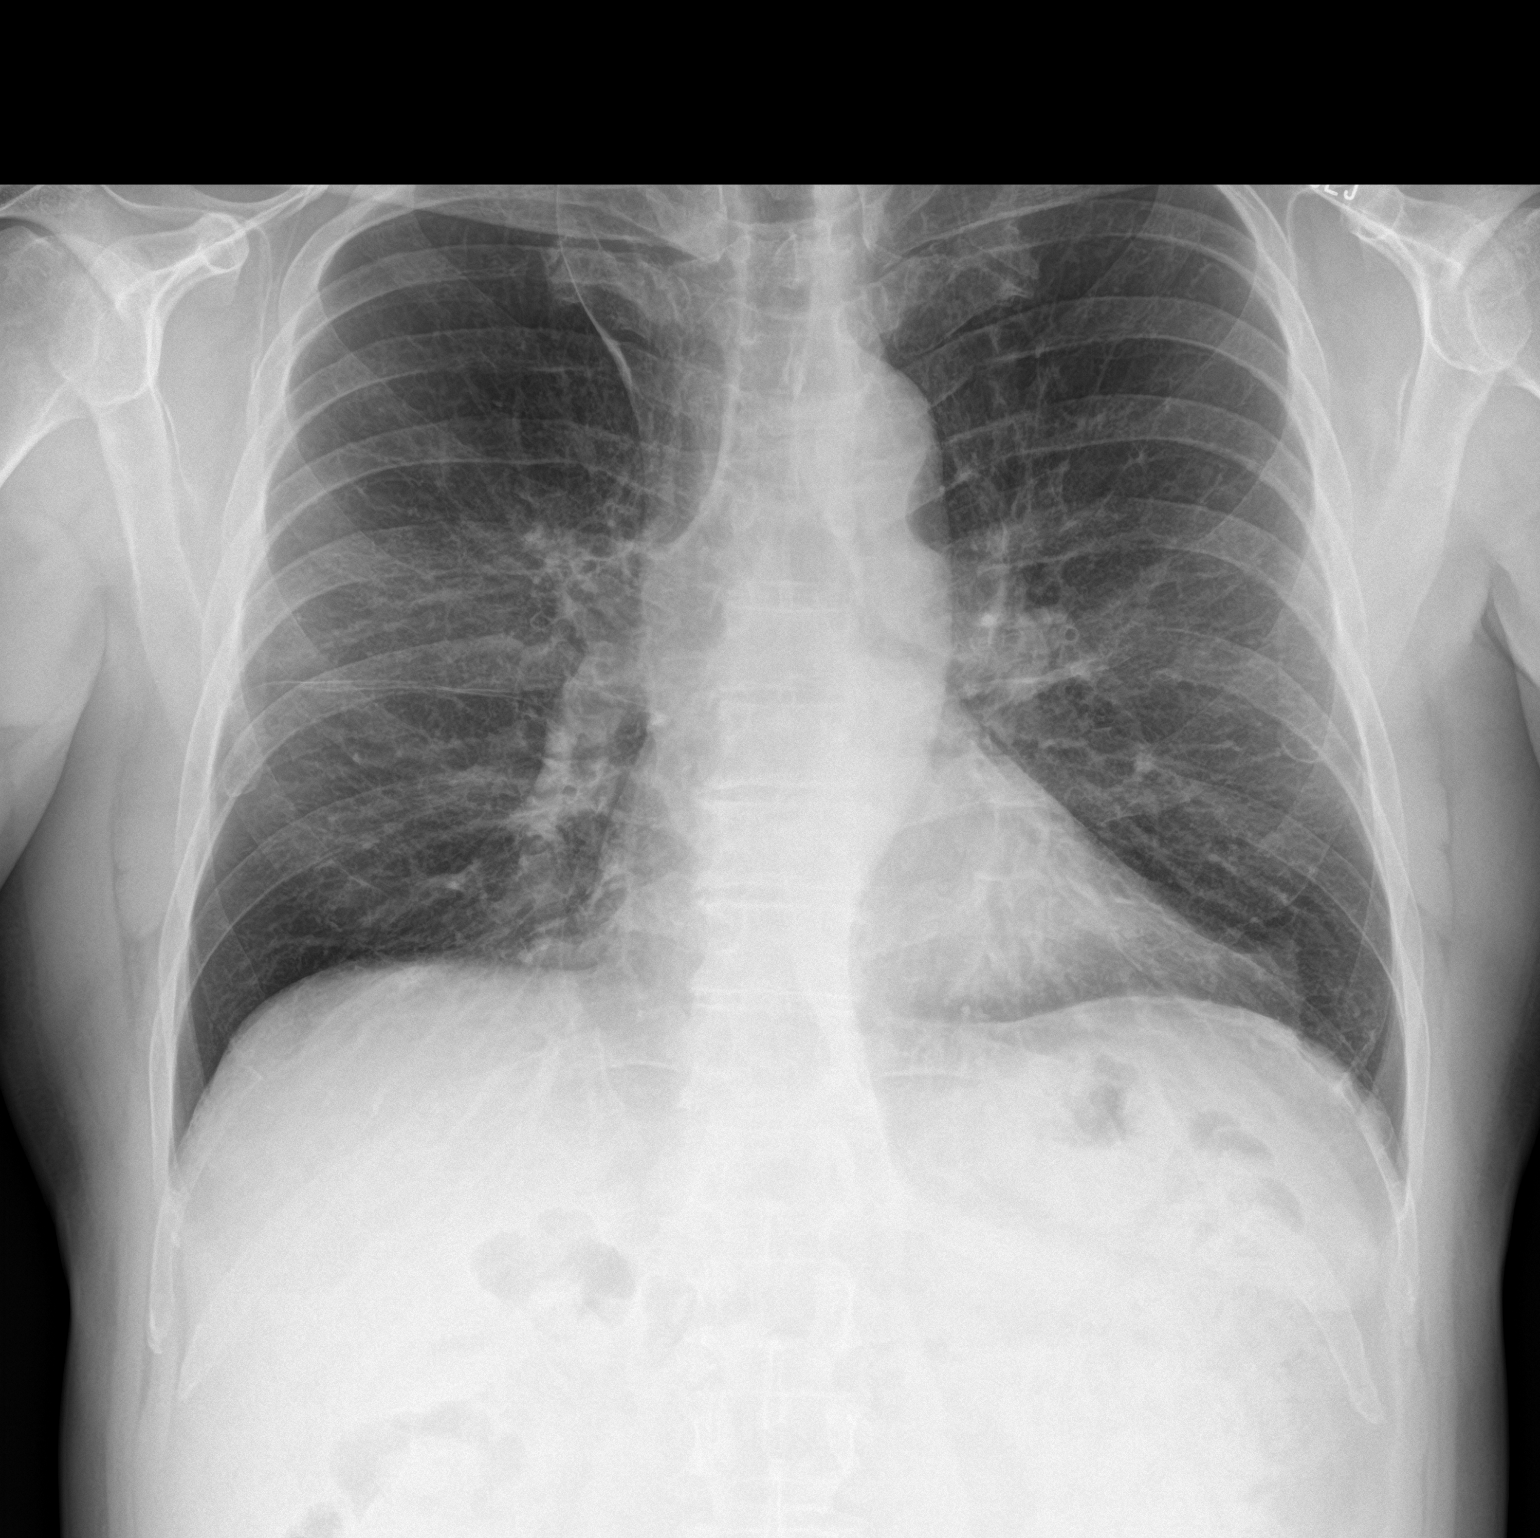

[chest lat]
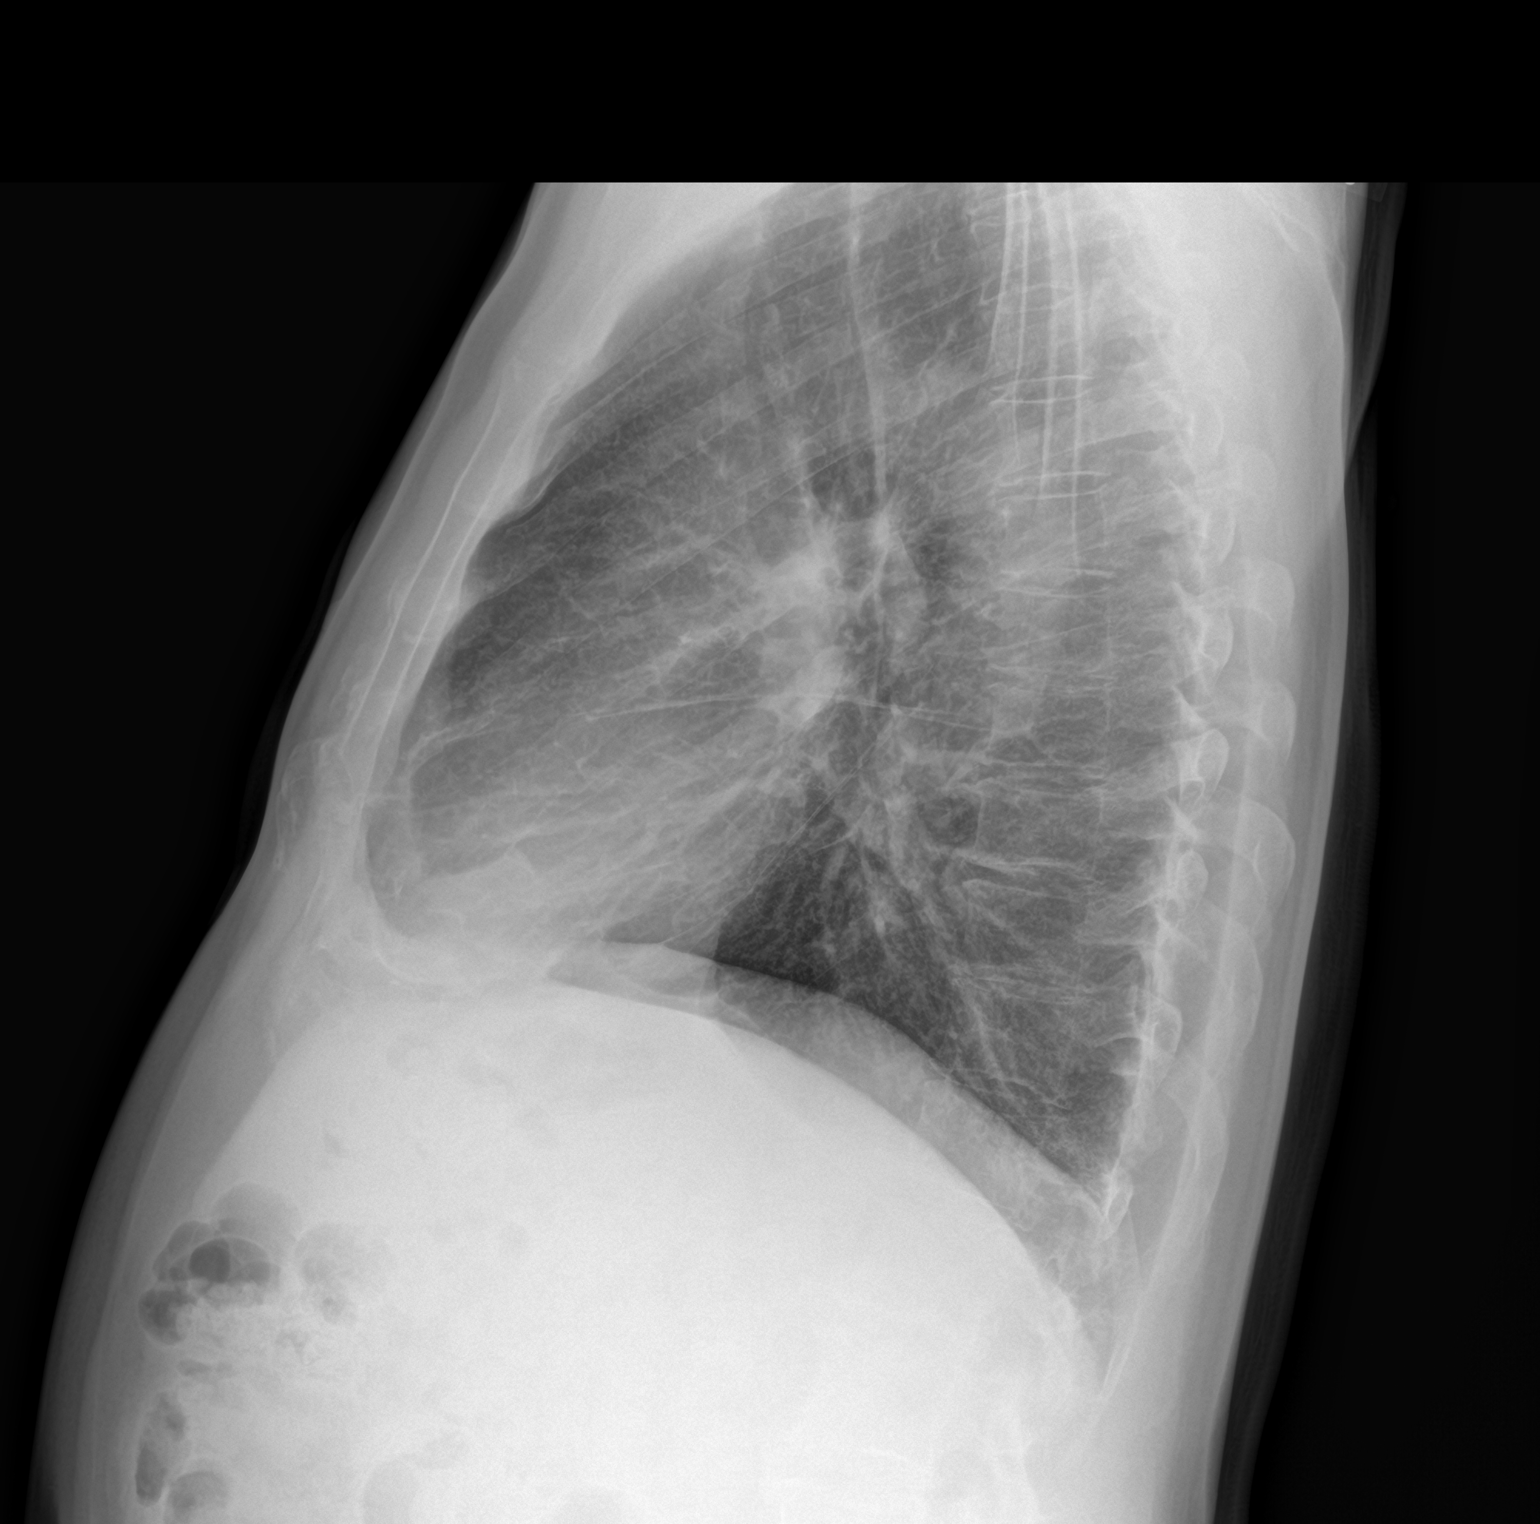

[2 of 2 positions shown; findings below may reference images not displayed]

FINDINGS: Cardiomediastinal silhouette is stable. Mild hyperinflation. Again
noted accessory azygos fissure/lobe. No infiltrate or pulmonary
edema. Mild degenerative changes lower thoracic spine.
IMPRESSION: No active disease.  Mild hyperinflation.
